# Patient Record
Sex: Female | Born: 1955 | Race: Black or African American | Hispanic: No | Marital: Married | Smoking: Former smoker
Health system: Southern US, Community
[De-identification: ages and names within clinical notes are randomized; demographics above are authoritative.]

## PROBLEM LIST (undated history)

## (undated) DIAGNOSIS — I10 Essential (primary) hypertension: Secondary | ICD-10-CM

## (undated) DIAGNOSIS — C189 Malignant neoplasm of colon, unspecified: Secondary | ICD-10-CM

## (undated) DIAGNOSIS — K219 Gastro-esophageal reflux disease without esophagitis: Secondary | ICD-10-CM

## (undated) DIAGNOSIS — E7800 Pure hypercholesterolemia, unspecified: Secondary | ICD-10-CM

## (undated) DIAGNOSIS — J449 Chronic obstructive pulmonary disease, unspecified: Secondary | ICD-10-CM

## (undated) DIAGNOSIS — E039 Hypothyroidism, unspecified: Secondary | ICD-10-CM

## (undated) DIAGNOSIS — Z8669 Personal history of other diseases of the nervous system and sense organs: Secondary | ICD-10-CM

## (undated) DIAGNOSIS — E559 Vitamin D deficiency, unspecified: Secondary | ICD-10-CM

## (undated) DIAGNOSIS — N1831 Chronic kidney disease, stage 3a: Secondary | ICD-10-CM

## (undated) DIAGNOSIS — E785 Hyperlipidemia, unspecified: Secondary | ICD-10-CM

## (undated) HISTORY — PX: ABDOMINAL HYSTERECTOMY: SHX81

## (undated) HISTORY — PX: OTHER SURGICAL HISTORY: SHX169

---
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
## 2006-02-04 DIAGNOSIS — C189 Malignant neoplasm of colon, unspecified: Secondary | ICD-10-CM

## 2006-02-04 HISTORY — DX: Malignant neoplasm of colon, unspecified: C18.9

## 2006-02-04 HISTORY — PX: COLON SURGERY: SHX602

## 2006-03-05 ENCOUNTER — Ambulatory Visit: Payer: Self-pay | Admitting: Nurse Practitioner

## 2006-03-11 ENCOUNTER — Ambulatory Visit: Payer: Self-pay | Admitting: Gastroenterology

## 2006-03-12 ENCOUNTER — Ambulatory Visit: Payer: Self-pay | Admitting: Family Medicine

## 2006-05-12 ENCOUNTER — Ambulatory Visit: Payer: Self-pay | Admitting: Surgery

## 2006-05-12 ENCOUNTER — Other Ambulatory Visit: Payer: Self-pay

## 2006-05-26 ENCOUNTER — Inpatient Hospital Stay: Payer: Self-pay | Admitting: Surgery

## 2007-03-31 ENCOUNTER — Ambulatory Visit: Payer: Self-pay | Admitting: Nurse Practitioner

## 2008-04-04 ENCOUNTER — Ambulatory Visit: Payer: Self-pay | Admitting: Nurse Practitioner

## 2009-04-06 ENCOUNTER — Ambulatory Visit: Payer: Self-pay | Admitting: Nurse Practitioner

## 2010-05-31 ENCOUNTER — Ambulatory Visit: Payer: Self-pay | Admitting: Family Medicine

## 2011-01-03 ENCOUNTER — Ambulatory Visit: Payer: Self-pay | Admitting: Gastroenterology

## 2011-01-04 LAB — PATHOLOGY REPORT

## 2011-10-03 ENCOUNTER — Ambulatory Visit: Payer: Self-pay | Admitting: Family Medicine

## 2012-11-01 ENCOUNTER — Ambulatory Visit: Payer: Self-pay | Admitting: Internal Medicine

## 2012-11-24 ENCOUNTER — Ambulatory Visit: Payer: Self-pay | Admitting: Family Medicine

## 2012-12-09 ENCOUNTER — Ambulatory Visit: Payer: Self-pay | Admitting: Family Medicine

## 2013-07-22 ENCOUNTER — Ambulatory Visit: Payer: Self-pay | Admitting: Family Medicine

## 2017-11-19 ENCOUNTER — Encounter: Payer: Self-pay | Admitting: Physical Therapy

## 2017-11-19 ENCOUNTER — Ambulatory Visit: Payer: BLUE CROSS/BLUE SHIELD | Attending: Orthopedic Surgery | Admitting: Physical Therapy

## 2017-11-19 DIAGNOSIS — R2689 Other abnormalities of gait and mobility: Secondary | ICD-10-CM | POA: Diagnosis present

## 2017-11-19 DIAGNOSIS — M6281 Muscle weakness (generalized): Secondary | ICD-10-CM | POA: Diagnosis present

## 2017-11-19 DIAGNOSIS — M25662 Stiffness of left knee, not elsewhere classified: Secondary | ICD-10-CM | POA: Diagnosis present
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
## 2017-11-19 NOTE — Patient Instructions (Signed)
Access Code: [email or web address]  URL: [email or web address]  Date: 11/19/2017  Prepared by: [place] Nation   Exercises  Supine Quad Set - 10 reps - 3 sets - 1x daily - 7x weekly  Supine Heel Slides - 10 reps - 3 sets - 1x daily - 7x weekly  4 Way Patellar Glide - 10 reps - 3 sets - 1x daily - 7x weekly

## 2017-11-25 ENCOUNTER — Ambulatory Visit: Payer: BLUE CROSS/BLUE SHIELD | Admitting: Physical Therapy

## 2017-11-25 ENCOUNTER — Encounter: Payer: Self-pay | Admitting: Physical Therapy

## 2017-11-25 DIAGNOSIS — R2689 Other abnormalities of gait and mobility: Secondary | ICD-10-CM

## 2017-11-25 DIAGNOSIS — M25662 Stiffness of left knee, not elsewhere classified: Secondary | ICD-10-CM

## 2017-11-25 DIAGNOSIS — M6281 Muscle weakness (generalized): Secondary | ICD-10-CM

## 2017-11-25 NOTE — Therapy (Signed)
[facility] [address] [place], [place], [address] Phone: [phone]   Fax:  [phone]  Physical Therapy Treatment  Patient Details  Name: Patricia Rose MRN: [id number] Date of Birth: May 12, 1955 Referring Provider (PT): Allean Found, MD   Encounter Date: 11/25/2017  PT End of Session - 11/25/17 1726    Visit Number  2    Number of Visits  8    Date for PT Re-Evaluation  12/17/17    PT Start Time  1426    PT Stop Time  1511    PT Time Calculation (min)  45 min    Equipment Utilized During Treatment  Left knee immobilizer    Activity Tolerance  Patient tolerated treatment well;Patient limited by pain    Behavior During Therapy  [facility] for tasks assessed/performed       History reviewed. No pertinent past medical history.  History reviewed. No pertinent surgical history.  There were no vitals filed for this visit.  Subjective Assessment - 11/25/17 1424    Subjective  Patient reports no significant changes since her last visit and has been doing the exercises on the RLE. She states she has no pain unless she has been sitting for awhile.    Patient is accompained by:  Family member    Limitations  Lifting;Standing;Walking;House hold activities    Diagnostic tests  X-ray    Patient Stated Goals  Increase L knee ROM/ strength    Currently in Pain?  No/denies    Pain Score  0-No pain       TREATMENT  Therapeutic Exercise: Glute sets 10 sec hold x10 R quad sets 5 sec hold x10 x 3 L heel slides/active flexion to 45*, 5 sec hold x10 L heel slides/passive flexion to 45-50* 20 sec hold x3 L quad sets 25% 3 sec hold x10 L hamstring isometrics 10 sec hold x10  Patient educated on HEP, surgical protocol, and expectations for timeline of wearing immobilizer and using crutches (per MD protocol).  Manual Therapy: Scar tissue massage/mobilization x6 min Gentle patella glides M/L grade 1/2 x8 min  Incision site looks  clean and there is no noted warmth or discoloration. Legs appear more symmetrical girth with decreased swelling. Patient donned and doffed immobilizer and compression stocking for therapy demonstrating competence with the device and management of the leg.    PT Long Term Goals - 11/19/17 1530      PT LONG TERM GOAL #1   Title  Pt. will complete FOTO and improve to 56 to improve daily functional mobility.    Baseline  11/19/17 FOTO: 30    Time  4    Period  Weeks    Status  New    Target Date  12/17/17      PT LONG TERM GOAL #2   Title  Pt. will achieve 0 degrees of knee extension on L LE for functional mobility around home.     Baseline  11/19/17 PROM: -6 deg    Time  4    Period  Weeks    Status  New    Target Date  12/17/17      PT LONG TERM GOAL #3   Title  Pt will increase strength of L LE by at least 1/2 MMT grade in order to demonstrate improvement in strength and function     Baseline  Unable to assess at this time due to surgical timeframe and MD protocol.    Time  4    Period  Weeks    Status  New    Target Date  12/17/17      PT LONG TERM GOAL #4   Title  Pt. will decrease overall edema of L LE to be WNL when compared to R LE.    Baseline  Circumferental Measurements L/R: Mid Calf: 31cm/29cm, Distal Incision Site: 33cm/31cm, 10 cm Superior to Patella: 45cm/39cm    Time  4    Period  Weeks    Status  New    Target Date  12/17/17            Plan - 11/25/17 1734    Clinical Impression Statement  Patient presents to clinic with no complaints of pain and is amenable to therapy. Patient was able to participate in protocol exercises with noted control of early quadriceps isometric contraction at 25% effort. Additionally, gentle medial/lateral mobilizations of the L patella indicate overall hypomobility, but the patella is noted to be moving appropriately for this stage post-surgically. Patient will continue to benefit from skilled therapeutic intervention in order to  address deficits in ROM, strength, and function, and improve overall QOL.    Rehab Potential  Good    PT Frequency  2x / week    PT Duration  4 weeks    PT Treatment/Interventions  Cryotherapy;Electrical Stimulation;Iontophoresis 4mg /ml Dexamethasone;Moist Heat;Traction;Ultrasound;Gait training;Stair training;Functional mobility training;Therapeutic activities;Therapeutic exercise;Manual techniques;Patient/family education;Neuromuscular re-education;Balance training;Scar mobilization;Energy conservation;Dry needling;Passive range of motion    PT Next Visit Plan  Progress treatment per MD protocol    PT Home Exercise Plan  see notes.    Consulted and Agree with Plan of Care  Patient;Family member/caregiver    Family Member Consulted  Husband       Patient will benefit from skilled therapeutic intervention in order to improve the following deficits and impairments:  Abnormal gait, Decreased endurance, Hypomobility, Impaired sensation, Increased edema, Decreased strength, Decreased activity tolerance, Decreased mobility, Difficulty walking, Decreased range of motion, Impaired flexibility  Visit Diagnosis: Muscle weakness (generalized)  Other abnormalities of gait and mobility  Decreased range of motion (ROM) of left knee     Problem List There are no active problems to display for this patient.   Myles Gip PT, DPT [phone] 11/25/2017, 5:38 PM  [facility] [address] [place], [place], [address] Phone: [phone]   Fax:  [phone]  Name: Patricia Rose MRN: [id number] Date of Birth: 1955/04/15

## 2017-11-27 ENCOUNTER — Encounter: Payer: Self-pay | Admitting: Physical Therapy

## 2017-11-27 ENCOUNTER — Ambulatory Visit: Payer: BLUE CROSS/BLUE SHIELD | Admitting: Physical Therapy

## 2017-11-27 DIAGNOSIS — M6281 Muscle weakness (generalized): Secondary | ICD-10-CM

## 2017-11-27 DIAGNOSIS — R2689 Other abnormalities of gait and mobility: Secondary | ICD-10-CM

## 2017-11-27 DIAGNOSIS — M25662 Stiffness of left knee, not elsewhere classified: Secondary | ICD-10-CM

## 2017-11-27 NOTE — Therapy (Signed)
[facility] [address] [place], [place], [address] Phone: [phone]   Fax:  [phone]  Physical Therapy Treatment  Patient Details  Name: Patricia Rose MRN: [id number] Date of Birth: 27-Mar-1955 Referring Provider (PT): Allean Found, MD   Encounter Date: 11/27/2017  PT End of Session - 11/27/17 1405    Visit Number  3    Number of Visits  8    Date for PT Re-Evaluation  12/17/17    PT Start Time  3474    PT Stop Time  2595    PT Time Calculation (min)  58 min    Equipment Utilized During Treatment  Left knee immobilizer    Activity Tolerance  Patient tolerated treatment well;Patient limited by pain    Behavior During Therapy  [facility] for tasks assessed/performed       History reviewed. No pertinent past medical history.  History reviewed. No pertinent surgical history.  There were no vitals filed for this visit.  Subjective Assessment - 11/27/17 1404    Subjective  Pt. reports she was not terribly sore after last visit.  Pt. notes she did take Tylenol prior to therapy today.    Patient is accompained by:  Family member    Limitations  Lifting;Standing;Walking;House hold activities    Diagnostic tests  X-ray    Patient Stated Goals  Increase L knee ROM/ strength    Currently in Pain?  No/denies    Pain Score  0-No pain             TREATMENT  Therapeutic Exercise: Glute sets 10 sec hold x10 R quad sets 5 sec hold x10 x 3 L heel slides/active flexion to 59-70 deg, 5 sec hold x15 L heel slides/passive flexion to 64-78 deg, 20 sec hold x3  Pt. Able to tolerate up to 78 deg max with PROM. L quad sets 25% 3 sec hold x10 L hamstring isometrics 10 sec hold x10   Manual Therapy: Scar tissue massage/mobilization x6 min Gentle patella glides M/L grade 1/2 x8 min  Patient educated on seated heel slides and breathing strategies to manage pain during ROM exercises.    PT Long Term Goals - 11/19/17  1530      PT LONG TERM GOAL #1   Title  Pt. will complete FOTO and improve to 56 to improve daily functional mobility.    Baseline  11/19/17 FOTO: 30    Time  4    Period  Weeks    Status  New    Target Date  12/17/17      PT LONG TERM GOAL #2   Title  Pt. will achieve 0 degrees of knee extension on L LE for functional mobility around home.     Baseline  11/19/17 PROM: -6 deg    Time  4    Period  Weeks    Status  New    Target Date  12/17/17      PT LONG TERM GOAL #3   Title  Pt will increase strength of L LE by at least 1/2 MMT grade in order to demonstrate improvement in strength and function     Baseline  Unable to assess at this time due to surgical timeframe and MD protocol.    Time  4    Period  Weeks    Status  New    Target Date  12/17/17      PT LONG TERM GOAL #4   Title  Pt. will decrease overall edema of L LE to be WNL when compared to R LE.    Baseline  Circumferental Measurements L/R: Mid Calf: 31cm/29cm, Distal Incision Site: 33cm/31cm, 10 cm Superior to Patella: 45cm/39cm    Time  4    Period  Weeks    Status  New    Target Date  12/17/17            Plan - 11/27/17 1406    Clinical Impression Statement  Pt. is making marked progress with L knee flexion n ability to perform AROM.  Pt. able to achieve greater range today as noted in detail while following MD protocol.  Pt. will continue with current HEP until next visit, where HEP will be updated per MD protocol in order to achieve 90 deg of AROM of L knee flexion.      Clinical Presentation  Evolving    Clinical Decision Making  Moderate    Rehab Potential  Good    PT Frequency  2x / week    PT Duration  4 weeks    PT Treatment/Interventions  Cryotherapy;Electrical Stimulation;Iontophoresis 4mg /ml Dexamethasone;Moist Heat;Traction;Ultrasound;Gait training;Stair training;Functional mobility training;Therapeutic activities;Therapeutic exercise;Manual techniques;Patient/family education;Neuromuscular  re-education;Balance training;Scar mobilization;Energy conservation;Dry needling;Passive range of motion    PT Next Visit Plan  Progress treatment per MD protocol    PT Home Exercise Plan  see notes.    Consulted and Agree with Plan of Care  Patient;Family member/caregiver    Family Member Consulted  Husband       Patient will benefit from skilled therapeutic intervention in order to improve the following deficits and impairments:  Abnormal gait, Decreased endurance, Hypomobility, Impaired sensation, Increased edema, Decreased strength, Decreased activity tolerance, Decreased mobility, Difficulty walking, Decreased range of motion, Impaired flexibility  Visit Diagnosis: Muscle weakness (generalized)  Other abnormalities of gait and mobility  Decreased range of motion (ROM) of left knee     Problem List There are no active problems to display for this patient.   [name], SPT  This entire session was performed under direct supervision and direction of a licensed therapist/therapist assistant . I have personally read, edited and approve of the note as written.  Myles Gip PT, DPT [phone] 11/27/2017, 5:54 PM  [facility] [address] [place], [place], [address] Phone: [phone]   Fax:  [phone]  Name: ALEXANDRIA SHIFLETT MRN: [id number] Date of Birth: 02/15/1955

## 2017-12-01 ENCOUNTER — Ambulatory Visit: Payer: BLUE CROSS/BLUE SHIELD | Admitting: Physical Therapy

## 2017-12-01 DIAGNOSIS — M6281 Muscle weakness (generalized): Secondary | ICD-10-CM | POA: Diagnosis not present

## 2017-12-01 DIAGNOSIS — R2689 Other abnormalities of gait and mobility: Secondary | ICD-10-CM

## 2017-12-01 DIAGNOSIS — M25662 Stiffness of left knee, not elsewhere classified: Secondary | ICD-10-CM

## 2017-12-01 NOTE — Therapy (Signed)
[facility] [address] [place], [place], [address] Phone: [phone]   Fax:  [phone]  Physical Therapy Treatment  Patient Details  Name: Patricia Rose MRN: [id number] Date of Birth: 1955-08-15 Referring Provider (PT): Allean Found, MD   Encounter Date: 12/01/2017  PT End of Session - 12/01/17 1706    Visit Number  4    Number of Visits  8    Date for PT Re-Evaluation  12/17/17    PT Start Time  4128    PT Stop Time  1610    PT Time Calculation (min)  55 min    Equipment Utilized During Treatment  Left knee immobilizer    Activity Tolerance  Patient tolerated treatment well;Patient limited by pain    Behavior During Therapy  [facility] for tasks assessed/performed       No past medical history on file.  No past surgical history on file.  There were no vitals filed for this visit.  Subjective Assessment - 12/01/17 1512    Subjective  Pt. reports to be doing HEP, specifically her knee flexion exercises at home.  No notes of increased pain when performing HEP except at end range flexion.      Patient is accompained by:  Family member    Limitations  Lifting;Standing;Walking;House hold activities    Diagnostic tests  X-ray    Patient Stated Goals  Increase L knee ROM/ strength    Currently in Pain?  No/denies    Pain Score  0-No pain            TREATMENT   Therapeutic Exercise: Glute sets 10 sec hold x10 R quad sets 5 sec hold x10 x 3 Seated/Supine L heel slides/active flexion to 82 deg, 5 sec hold x15 L heel slides/passive flexion to 84 deg, 20 sec hold x3 L quad sets 25% 3 sec hold x10 L hamstring isometrics 10 sec hold x10 Walking in // bars with ~80% body weight x4  Seated L heel slides/active flexion to 82 deg   Manual Therapy: Scar tissue massage/mobilization x6 min Gentle patella glides M/L grade I-II x8 min  Patient educated on seated heel slides and breathing strategies to manage pain  during ROM exercises.         PT Long Term Goals - 11/19/17 1530      PT LONG TERM GOAL #1   Title  Pt. will complete FOTO and improve to 56 to improve daily functional mobility.    Baseline  11/19/17 FOTO: 30    Time  4    Period  Weeks    Status  New    Target Date  12/17/17      PT LONG TERM GOAL #2   Title  Pt. will achieve 0 degrees of knee extension on L LE for functional mobility around home.     Baseline  11/19/17 PROM: -6 deg    Time  4    Period  Weeks    Status  New    Target Date  12/17/17      PT LONG TERM GOAL #3   Title  Pt will increase strength of L LE by at least 1/2 MMT grade in order to demonstrate improvement in strength and function     Baseline  Unable to assess at this time due to surgical timeframe and MD protocol.    Time  4    Period  Weeks    Status  New  Target Date  12/17/17      PT LONG TERM GOAL #4   Title  Pt. will decrease overall edema of L LE to be WNL when compared to R LE.    Baseline  Circumferental Measurements L/R: Mid Calf: 31cm/29cm, Distal Incision Site: 33cm/31cm, 10 cm Superior to Patella: 45cm/39cm    Time  4    Period  Weeks    Status  New    Target Date  12/17/17            Plan - 12/01/17 1707    Clinical Impression Statement  Pt. continues to progress towards MD protocol and is able to achieve 82 deg of L knee flexion actively.  Pt. will continue to perform HEP and follow MD protocol to increase ROM and increase weight bearing as tolerated.  Pt. was able to walk in // bars with no increase in pain and support 80% body weight with no discomfort.    Clinical Presentation  Evolving    Clinical Decision Making  Moderate    Rehab Potential  Good    PT Frequency  2x / week    PT Duration  4 weeks    PT Treatment/Interventions  Cryotherapy;Electrical Stimulation;Iontophoresis 4mg /ml Dexamethasone;Moist Heat;Traction;Ultrasound;Gait training;Stair training;Functional mobility training;Therapeutic  activities;Therapeutic exercise;Manual techniques;Patient/family education;Neuromuscular re-education;Balance training;Scar mobilization;Energy conservation;Dry needling;Passive range of motion    PT Next Visit Plan  Progress treatment per MD protocol    PT Home Exercise Plan  see notes.    Consulted and Agree with Plan of Care  Patient;Family member/caregiver    Family Member Consulted  Husband       Patient will benefit from skilled therapeutic intervention in order to improve the following deficits and impairments:  Abnormal gait, Decreased endurance, Hypomobility, Impaired sensation, Increased edema, Decreased strength, Decreased activity tolerance, Decreased mobility, Difficulty walking, Decreased range of motion, Impaired flexibility  Visit Diagnosis: Muscle weakness (generalized)  Other abnormalities of gait and mobility  Decreased range of motion (ROM) of left knee     Problem List There are no active problems to display for this patient.   [name], SPT  This entire session was performed under direct supervision and direction of a licensed therapist/therapist assistant . I have personally read, edited and approve of the note as written.  Myles Gip PT, DPT [phone] 12/01/2017, 5:12 PM  [facility] [address] [place], [place], [address] Phone: [phone]   Fax:  [phone]  Name: Patricia Rose MRN: [id number] Date of Birth: 09-21-1955

## 2017-12-03 ENCOUNTER — Ambulatory Visit: Payer: BLUE CROSS/BLUE SHIELD | Admitting: Physical Therapy

## 2017-12-03 ENCOUNTER — Encounter: Payer: Self-pay | Admitting: Physical Therapy

## 2017-12-03 DIAGNOSIS — M25662 Stiffness of left knee, not elsewhere classified: Secondary | ICD-10-CM

## 2017-12-03 DIAGNOSIS — M6281 Muscle weakness (generalized): Secondary | ICD-10-CM

## 2017-12-03 DIAGNOSIS — R2689 Other abnormalities of gait and mobility: Secondary | ICD-10-CM

## 2017-12-03 NOTE — Therapy (Signed)
[facility] [address]. [place], [place], [address] Phone: [phone]   Fax:  [phone]  Physical Therapy Treatment  Patient Details  Name: Patricia Rose MRN: [id number] Date of Birth: 06-11-1955 Referring Provider (PT): Allean Found, MD   Encounter Date: 12/03/2017  PT End of Session - 12/03/17 1527    Visit Number  5    Number of Visits  8    Date for PT Re-Evaluation  12/17/17    PT Start Time  0626    PT Stop Time  1511    PT Time Calculation (min)  53 min    Equipment Utilized During Treatment  Left knee immobilizer    Activity Tolerance  Patient tolerated treatment well;Patient limited by pain    Behavior During Therapy  [facility] for tasks assessed/performed       History reviewed. No pertinent past medical history.  History reviewed. No pertinent surgical history.  There were no vitals filed for this visit.  Subjective Assessment - 12/03/17 1526    Subjective  Pt. has been consistent with HEP and reports to be walking much better with decreased use of crutches.    Patient is accompained by:  Family member    Limitations  Lifting;Standing;Walking;House hold activities    Diagnostic tests  X-ray    Patient Stated Goals  Increase L knee ROM/ strength    Currently in Pain?  No/denies    Pain Score  0-No pain       TREATMENT   Therapeutic Exercise: Glute sets 10 sec hold x10 R quad sets 5 sec hold x10 x 3 Seated/Supine L heel slides/active flexion to85 deg, 5 sec hold x15 L heel slides/passive flexion to91 deg,20 sec hold x3 L quad sets 25% 3 sec hold x10 L hamstring isometrics 10 sec hold x10 Walking in // bars with ~90% body weight x4    Manual Therapy: Scar tissue massage/mobilization x7 min Gentle patella glides M/L grade I-II x7 min    PT Long Term Goals - 11/19/17 1530      PT LONG TERM GOAL #1   Title  Pt. will complete FOTO and improve to 56 to improve daily functional  mobility.    Baseline  11/19/17 FOTO: 30    Time  4    Period  Weeks    Status  New    Target Date  12/17/17      PT LONG TERM GOAL #2   Title  Pt. will achieve 0 degrees of knee extension on L LE for functional mobility around home.     Baseline  11/19/17 PROM: -6 deg    Time  4    Period  Weeks    Status  New    Target Date  12/17/17      PT LONG TERM GOAL #3   Title  Pt will increase strength of L LE by at least 1/2 MMT grade in order to demonstrate improvement in strength and function     Baseline  Unable to assess at this time due to surgical timeframe and MD protocol.    Time  4    Period  Weeks    Status  New    Target Date  12/17/17      PT LONG TERM GOAL #4   Title  Pt. will decrease overall edema of L LE to be WNL when compared to R LE.    Baseline  Circumferental Measurements L/R: Mid Calf: 31cm/29cm,  Distal Incision Site: 33cm/31cm, 10 cm Superior to Patella: 45cm/39cm    Time  4    Period  Weeks    Status  New    Target Date  12/17/17            Plan - 12/03/17 1527    Clinical Impression Statement  Pt. is making significant progress with exercises and is able to tolerate increased body weight on the L LE.  Pt. able to achieve 91 deg of L knee flexion passively.  Pt. will continue to work with HEP and follow MD protocol as treatment progresses.    Clinical Presentation  Evolving    Clinical Decision Making  Moderate    Rehab Potential  Good    PT Frequency  2x / week    PT Duration  4 weeks    PT Treatment/Interventions  Cryotherapy;Electrical Stimulation;Iontophoresis 4mg /ml Dexamethasone;Moist Heat;Traction;Ultrasound;Gait training;Stair training;Functional mobility training;Therapeutic activities;Therapeutic exercise;Manual techniques;Patient/family education;Neuromuscular re-education;Balance training;Scar mobilization;Energy conservation;Dry needling;Passive range of motion    PT Next Visit Plan  Progress treatment per MD protocol    PT Home Exercise  Plan  see notes.    Consulted and Agree with Plan of Care  Patient;Family member/caregiver    Family Member Consulted  Husband       Patient will benefit from skilled therapeutic intervention in order to improve the following deficits and impairments:  Abnormal gait, Decreased endurance, Hypomobility, Impaired sensation, Increased edema, Decreased strength, Decreased activity tolerance, Decreased mobility, Difficulty walking, Decreased range of motion, Impaired flexibility  Visit Diagnosis: Muscle weakness (generalized)  Other abnormalities of gait and mobility  Decreased range of motion (ROM) of left knee     Problem List There are no active problems to display for this patient.   Patricia Rose, SPT  This entire session was performed under direct supervision and direction of a licensed therapist/therapist assistant . I have personally read, edited and approve of the note as written.  Myles Gip PT, DPT [phone] 12/03/2017, 3:39 PM  [place] [facility] [address] [place], [place], [address] Phone: [phone]   Fax:  [phone]  Name: Patricia Rose MRN: [id number] Date of Birth: 1955/09/11

## 2017-12-08 ENCOUNTER — Ambulatory Visit: Payer: BLUE CROSS/BLUE SHIELD | Attending: Orthopedic Surgery | Admitting: Physical Therapy

## 2017-12-08 DIAGNOSIS — M25662 Stiffness of left knee, not elsewhere classified: Secondary | ICD-10-CM | POA: Insufficient documentation

## 2017-12-08 DIAGNOSIS — R2689 Other abnormalities of gait and mobility: Secondary | ICD-10-CM

## 2017-12-08 DIAGNOSIS — M6281 Muscle weakness (generalized): Secondary | ICD-10-CM | POA: Insufficient documentation

## 2017-12-08 NOTE — Therapy (Signed)
[place] [facility] [address] [place], [place], [address] Phone: [phone]   Fax:  [phone]  Physical Therapy Treatment  Patient Details  Name: Patricia Rose MRN: [id number] Date of Birth: 09-03-1955 Referring Provider (PT): Allean Found, MD   Encounter Date: 12/08/2017  PT End of Session - 12/08/17 1735    Visit Number  6    Number of Visits  8    Date for PT Re-Evaluation  12/17/17    PT Start Time  7096    PT Stop Time  1603    PT Time Calculation (min)  51 min    Equipment Utilized During Treatment  Left knee immobilizer    Activity Tolerance  Patient tolerated treatment well;Patient limited by pain    Behavior During Therapy  [facility] for tasks assessed/performed       No past medical history on file.  No past surgical history on file.  There were no vitals filed for this visit.  Subjective Assessment - 12/08/17 1734    Subjective  Pt. continues to report no increase in pain and has been performing HEP at home on a consistent basis.    Patient is accompained by:  Family member    Limitations  Lifting;Standing;Walking;House hold activities    Diagnostic tests  X-ray    Patient Stated Goals  Increase L knee ROM/ strength    Currently in Pain?  No/denies    Pain Score  0-No pain    Multiple Pain Sites  No          TREATMENT   Therapeutic Exercise: R quad sets 5 sec hold x10 x 3 Seated/SupineL heel slides/active flexion, 5 sec hold x15 L heel slides/passive flexion,20 sec hold x3 L quad sets 25% 3 sec hold x10 L hamstring isometrics 10 sec hold x10 L SLR x10 with muscle fasciculations occurring in quads   Manual Therapy: Scar tissue massage/mobilization x15 min Gentle patella glides M/L gradeI-IIx15 min      PT Long Term Goals - 11/19/17 1530      PT LONG TERM GOAL #1   Title  Pt. will complete FOTO and improve to 56 to improve daily functional mobility.    Baseline  11/19/17 FOTO: 30     Time  4    Period  Weeks    Status  New    Target Date  12/17/17      PT LONG TERM GOAL #2   Title  Pt. will achieve 0 degrees of knee extension on L LE for functional mobility around home.     Baseline  11/19/17 PROM: -6 deg    Time  4    Period  Weeks    Status  New    Target Date  12/17/17      PT LONG TERM GOAL #3   Title  Pt will increase strength of L LE by at least 1/2 MMT grade in order to demonstrate improvement in strength and function     Baseline  Unable to assess at this time due to surgical timeframe and MD protocol.    Time  4    Period  Weeks    Status  New    Target Date  12/17/17      PT LONG TERM GOAL #4   Title  Pt. will decrease overall edema of L LE to be WNL when compared to R LE.    Baseline  Circumferental Measurements L/R: Mid Calf: 31cm/29cm, Distal  Incision Site: 33cm/31cm, 10 cm Superior to Patella: 45cm/39cm    Time  4    Period  Weeks    Status  New    Target Date  12/17/17         Plan - 12/08/17 1736    Clinical Impression Statement  Pt. had difficulty with getting to 90 deg of flexion today with both PROM/AROM.  Pt. was able to achieve 84 deg of PROM and 82 of AROM in seated position.  Pt. will continue with HEP supplied and follow MD protocol at treatment progresses.  Pt. did demonstrate increased SLR strength and was able to perform x10.    Clinical Presentation  Evolving    Clinical Decision Making  Moderate    Rehab Potential  Good    PT Frequency  2x / week    PT Duration  4 weeks    PT Treatment/Interventions  Cryotherapy;Electrical Stimulation;Iontophoresis 4mg /ml Dexamethasone;Moist Heat;Traction;Ultrasound;Gait training;Stair training;Functional mobility training;Therapeutic activities;Therapeutic exercise;Manual techniques;Patient/family education;Neuromuscular re-education;Balance training;Scar mobilization;Energy conservation;Dry needling;Passive range of motion    PT Next Visit Plan  Progress treatment per MD protocol    PT  Home Exercise Plan  see notes.    Consulted and Agree with Plan of Care  Patient;Family member/caregiver    Family Member Consulted  Husband       Patient will benefit from skilled therapeutic intervention in order to improve the following deficits and impairments:  Abnormal gait, Decreased endurance, Hypomobility, Impaired sensation, Increased edema, Decreased strength, Decreased activity tolerance, Decreased mobility, Difficulty walking, Decreased range of motion, Impaired flexibility  Visit Diagnosis: Muscle weakness (generalized)  Other abnormalities of gait and mobility  Decreased range of motion (ROM) of left knee     Problem List There are no active problems to display for this patient.  [name], PT, DPT # [id number] [name], SPT 12/08/2017, 5:38 PM  [place] [facility] [address] [place], [place], [address] Phone: [phone]   Fax:  [phone]  Name: Patricia Rose MRN: [id number] Date of Birth: Dec 22, 1955

## 2017-12-10 ENCOUNTER — Encounter: Payer: Self-pay | Admitting: Physical Therapy

## 2017-12-10 ENCOUNTER — Ambulatory Visit: Payer: BLUE CROSS/BLUE SHIELD | Admitting: Physical Therapy

## 2017-12-10 DIAGNOSIS — M6281 Muscle weakness (generalized): Secondary | ICD-10-CM | POA: Diagnosis not present

## 2017-12-10 DIAGNOSIS — M25662 Stiffness of left knee, not elsewhere classified: Secondary | ICD-10-CM

## 2017-12-10 DIAGNOSIS — R2689 Other abnormalities of gait and mobility: Secondary | ICD-10-CM

## 2017-12-10 NOTE — Therapy (Signed)
[place] [facility] [address]. [place], [place], [address] Phone: [phone]   Fax:  [phone]  Physical Therapy Treatment  Patient Details  Name: Patricia Rose MRN: [id number] Date of Birth: May 04, 1955 Referring Provider (PT): Allean Found, MD   Encounter Date: 12/10/2017  PT End of Session - 12/10/17 1754    Visit Number  7    Number of Visits  8    Date for PT Re-Evaluation  12/17/17    PT Start Time  6644    PT Stop Time  1517    PT Time Calculation (min)  50 min    Equipment Utilized During Treatment  Left knee immobilizer    Activity Tolerance  Patient tolerated treatment well;Patient limited by pain    Behavior During Therapy  [facility] for tasks assessed/performed       No past medical history on file.  No past surgical history on file.  There were no vitals filed for this visit.  Subjective Assessment - 12/10/17 1753    Subjective  Pt. reports no increase in pain, however her leg feels somewhat stiff today.    Patient is accompained by:  Family member    Limitations  Lifting;Standing;Walking;House hold activities    Diagnostic tests  X-ray    Patient Stated Goals  Increase L knee ROM/ strength    Currently in Pain?  No/denies    Pain Score  0-No pain          TREATMENT   Therapeutic Exercise: R quad sets 5 sec hold x10 x 3 Seated/SupineL heel slides/active flexion, 5 sec hold x15 L heel slides/passive flexion,20 sec hold x3 L quad sets 25% 3 sec hold x10 L hamstring isometrics 10 sec hold x10 L SLR x10 with muscle fasciculations occurring in quads Ambulation in hallway with single crutch, x4 Ambulation in hallway with handheld support, x4  Manual Therapy: Scar tissue massage/mobilization x28min Gentle patella glides M/L gradeI-IIx37min      PT Long Term Goals - 11/19/17 1530      PT LONG TERM GOAL #1   Title  Pt. will complete FOTO and improve to 56 to improve daily functional  mobility.    Baseline  11/19/17 FOTO: 30    Time  4    Period  Weeks    Status  New    Target Date  12/17/17      PT LONG TERM GOAL #2   Title  Pt. will achieve 0 degrees of knee extension on L LE for functional mobility around home.     Baseline  11/19/17 PROM: -6 deg    Time  4    Period  Weeks    Status  New    Target Date  12/17/17      PT LONG TERM GOAL #3   Title  Pt will increase strength of L LE by at least 1/2 MMT grade in order to demonstrate improvement in strength and function     Baseline  Unable to assess at this time due to surgical timeframe and MD protocol.    Time  4    Period  Weeks    Status  New    Target Date  12/17/17      PT LONG TERM GOAL #4   Title  Pt. will decrease overall edema of L LE to be WNL when compared to R LE.    Baseline  Circumferental Measurements L/R: Mid Calf: 31cm/29cm, Distal Incision Site: 33cm/31cm,  10 cm Superior to Patella: 45cm/39cm    Time  4    Period  Weeks    Status  New    Target Date  12/17/17         Plan - 12/10/17 1755    Clinical Impression Statement  Pt. was able to achieve 94 deg of knee flexion with slight overpressure from therapist.  Pt. did have significant hypomobility of L LE, however progressed to being able to tolerate slight overpressure of knee flexion.  P. consistently has adequate knee extension PROM.  Pt. to continue with therapy per MD protocol and will be seeing MD next week.    Clinical Presentation  Evolving    Clinical Decision Making  Moderate    Rehab Potential  Good    PT Frequency  2x / week    PT Duration  4 weeks    PT Treatment/Interventions  Cryotherapy;Electrical Stimulation;Iontophoresis 4mg /ml Dexamethasone;Moist Heat;Traction;Ultrasound;Gait training;Stair training;Functional mobility training;Therapeutic activities;Therapeutic exercise;Manual techniques;Patient/family education;Neuromuscular re-education;Balance training;Scar mobilization;Energy conservation;Dry needling;Passive range  of motion    PT Next Visit Plan  Progress treatment per MD protocol    PT Home Exercise Plan  see notes.    Consulted and Agree with Plan of Care  Patient;Family member/caregiver    Family Member Consulted  Husband       Patient will benefit from skilled therapeutic intervention in order to improve the following deficits and impairments:  Abnormal gait, Decreased endurance, Hypomobility, Impaired sensation, Increased edema, Decreased strength, Decreased activity tolerance, Decreased mobility, Difficulty walking, Decreased range of motion, Impaired flexibility  Visit Diagnosis: Muscle weakness (generalized)  Other abnormalities of gait and mobility  Decreased range of motion (ROM) of left knee     Problem List There are no active problems to display for this patient.  [name], PT, DPT # [id number] [name], SPT 12/10/2017, 6:08 PM  [place] [facility] [address] [place], [place], [address] Phone: [phone]   Fax:  [phone]  Name: Patricia Rose MRN: [id number] Date of Birth: 1955/05/18

## 2017-12-11 ENCOUNTER — Encounter: Payer: Self-pay | Admitting: Physical Therapy

## 2017-12-15 ENCOUNTER — Ambulatory Visit: Payer: BLUE CROSS/BLUE SHIELD | Admitting: Physical Therapy

## 2017-12-15 DIAGNOSIS — M6281 Muscle weakness (generalized): Secondary | ICD-10-CM

## 2017-12-15 DIAGNOSIS — R2689 Other abnormalities of gait and mobility: Secondary | ICD-10-CM

## 2017-12-15 DIAGNOSIS — M25662 Stiffness of left knee, not elsewhere classified: Secondary | ICD-10-CM

## 2017-12-15 NOTE — Therapy (Signed)
[facility] [address] [place], [place], [address] Phone: [phone]   Fax:  [phone]  Physical Therapy Treatment  Patient Details  Name: Patricia Rose MRN: [id number] Date of Birth: [birth year] Referring Provider (PT): Allean Found, MD   Encounter Date: 12/15/2017  PT End of Session - 12/15/17 1609    Visit Number  8    Number of Visits  8    Date for PT Re-Evaluation  12/17/17    PT Start Time  4431    PT Stop Time  1524    PT Time Calculation (min)  53 min    Equipment Utilized During Treatment  Left knee immobilizer    Activity Tolerance  Patient tolerated treatment well;Patient limited by pain    Behavior During Therapy  [facility] for tasks assessed/performed       No past medical history on file.  No past surgical history on file.  There were no vitals filed for this visit.  Subjective Assessment - 12/15/17 1555    Subjective  Pt. reports no new issues.  Pt. states she has no pain at rest and >8/10 L knee pain with PROM.      Patient is accompained by:  Family member    Limitations  Lifting;Standing;Walking;House hold activities    Diagnostic tests  X-ray    Patient Stated Goals  Increase L knee ROM/ strength    Currently in Pain?  No/denies         TREATMENT   Therapeutic Exercise: Rquad sets 5 sec hold x10 x 3 Seated/SupineL heel slides/active flexion, 5 sec hold x15 L heel slides/passive flexion,20 sec hold x3 L quad sets 25% 3 sec hold x10 L hamstring isometrics 10 sec hold x10 R SLR x20 with muscle fasciculations Ambulation in hallway with single crutch progressing to no assistive device  Manual Therapy: Scar tissue massage/mobilization x75min Gentle patella glides M/L gradeI-IIx30min Supine L knee AA/PROM flexion with static holds (as tolerated).     PT Long Term Goals - 11/19/17 1530      PT LONG TERM GOAL #1   Title  Pt. will complete FOTO and improve to 56 to improve daily  functional mobility.    Baseline  11/19/17 FOTO: 30    Time  4    Period  Weeks    Status  New    Target Date  12/17/17      PT LONG TERM GOAL #2   Title  Pt. will achieve 0 degrees of knee extension on L LE for functional mobility around home.     Baseline  11/19/17 PROM: -6 deg    Time  4    Period  Weeks    Status  New    Target Date  12/17/17      PT LONG TERM GOAL #3   Title  Pt will increase strength of L LE by at least 1/2 MMT grade in order to demonstrate improvement in strength and function     Baseline  Unable to assess at this time due to surgical timeframe and MD protocol.    Time  4    Period  Weeks    Status  New    Target Date  12/17/17      PT LONG TERM GOAL #4   Title  Pt. will decrease overall edema of L LE to be WNL when compared to R LE.    Baseline  Circumferental Measurements L/R: Mid Calf: 31cm/29cm, Distal  Incision Site: 33cm/31cm, 10 cm Superior to Patella: 45cm/39cm    Time  4    Period  Weeks    Status  New    Target Date  12/17/17          Plan - 12/15/17 1610    Clinical Impression Statement  Pt. demonstrates moderate L knee joint stiffness with flexion in supine position prior to manual tx. session.  L knee AROM: 95 deg. (extra time/ cuing) and 101 deg. PROM in supine.  No c/o L knee pain at rest and will walking in hallway with no assistive device except L knee brace.      Clinical Presentation  Evolving    Clinical Decision Making  Moderate    Rehab Potential  Good    PT Frequency  2x / week    PT Duration  4 weeks    PT Treatment/Interventions  Cryotherapy;Electrical Stimulation;Iontophoresis 4mg /ml Dexamethasone;Moist Heat;Traction;Ultrasound;Gait training;Stair training;Functional mobility training;Therapeutic activities;Therapeutic exercise;Manual techniques;Patient/family education;Neuromuscular re-education;Balance training;Scar mobilization;Energy conservation;Dry needling;Passive range of motion    PT Next Visit Plan  Progress  treatment per MD protocol.  CHECK GOALS/ RECERT    PT Home Exercise Plan  see notes.       Patient will benefit from skilled therapeutic intervention in order to improve the following deficits and impairments:  Abnormal gait, Decreased endurance, Hypomobility, Impaired sensation, Increased edema, Decreased strength, Decreased activity tolerance, Decreased mobility, Difficulty walking, Decreased range of motion, Impaired flexibility  Visit Diagnosis: Muscle weakness (generalized)  Other abnormalities of gait and mobility  Decreased range of motion (ROM) of left knee     Problem List There are no active problems to display for this patient.  [name], PT, DPT # [phone] 12/15/2017, 4:31 PM  [place] [facility] [address] [place], [place], [address] Phone: [phone]   Fax:  [phone]  Name: Patricia Rose MRN: [id number] Date of Birth: 01/21/1956

## 2017-12-17 ENCOUNTER — Ambulatory Visit: Payer: BLUE CROSS/BLUE SHIELD | Admitting: Physical Therapy

## 2017-12-17 ENCOUNTER — Encounter: Payer: Self-pay | Admitting: Physical Therapy

## 2017-12-17 DIAGNOSIS — R2689 Other abnormalities of gait and mobility: Secondary | ICD-10-CM

## 2017-12-17 DIAGNOSIS — M25662 Stiffness of left knee, not elsewhere classified: Secondary | ICD-10-CM

## 2017-12-17 DIAGNOSIS — M6281 Muscle weakness (generalized): Secondary | ICD-10-CM

## 2017-12-19 NOTE — Therapy (Signed)
[facility] [address] [place], [place], [address] Phone: [phone]   Fax:  [phone]  Physical Therapy Treatment  Patient Details  Name: Patricia Rose MRN: [id number] Date of Birth: 09-08-1955 Referring Provider (PT): Allean Found, MD   Encounter Date: 12/17/2017  Treatment: 9 of 17.  Recert date: 56/25/63 8937 to 1522   History reviewed. No pertinent past medical history.  History reviewed. No pertinent surgical history.  There were no vitals filed for this visit.    [facility] PT Assessment - 12/20/17 0001      Assessment   Medical Diagnosis  L Patellar ORIF    Referring Provider (PT)  Allean Found, MD    Onset Date/Surgical Date  11/11/17    Prior Therapy  No      Balance Screen   Has the patient fallen in the past 6 months  No      Prior Function   Level of Independence  Independent          Pt. states she returns to MD this Friday for f/u appt. Pt. has remained compliant with HEP and use of brace per MD protocol.          TREATMENT   Therapeutic Exercise: Rquad sets 5 sec hold x10 x 3 Seated/SupineL heel slides/active flexion, 5 sec hold x15 L heel slides/passive flexion,20 sec hold x3 L quad sets (as tolerated) with increase hold L hamstring isometrics 10 sec hold x10 R SLR x20 with muscle fasciculations Ambulation in hallway with single crutch progressing to no assistive device  Manual Therapy: Scar tissue massage/mobilization x62mn Gentle patella glides M/L gradeI-IIx514m Supine L knee AA/PROM flexion with static holds (as tolerated).          FOTO: initial: 30/ today: 48/ goal: 56.  Pt. remains hypomobile with L knee flexion AROM (96 deg.)- after manual tx./ cuing.  Pt. able to increase L knee flexion to 102 deg. with PROM/ manual holds.  Pt. limited by L knee pain/ significant suprapatellar tendon tightness.  PT has been progressing pt. with specific MD  protocol.  Pt. will continue with HEP at this time and will discuss any change in tx. status after MD f/u visits.       PT Long Term Goals - 12/17/17 2029      PT LONG TERM GOAL #1   Title  Pt. will complete FOTO and improve to 56 to improve daily functional mobility.    Baseline  11/19/17 FOTO: 30.  11/13: 48    Time  4    Period  Weeks    Status  Partially Met    Target Date  01/15/18      PT LONG TERM GOAL #2   Title  Pt. will achieve 0 degrees of knee extension on L LE for functional mobility around home.     Baseline  11/19/17 PROM: -6 deg.  11/13: 0 deg. extension in supine position    Time  4    Period  Weeks    Status  Achieved    Target Date  12/17/17      PT LONG TERM GOAL #3   Title  Pt will increase strength of L LE by at least 1/2 MMT grade in order to demonstrate improvement in strength and function     Baseline  Unable to assess at this time due to surgical timeframe and MD protocol.    Time  4    Period  Weeks  Status  On-going    Target Date  01/15/18      PT LONG TERM GOAL #4   Title  Pt. will decrease overall edema of L LE to be WNL when compared to R LE.    Baseline  Minimal L knee joint line swelling noted but moderate L quad muscle atrophy.      Time  4    Period  Weeks    Status  Achieved    Target Date  12/17/17      PT LONG TERM GOAL #5   Title  Pt. will increase L knee flexion to >110 deg. to improve sit to stands/ gait pattern progression outside of brace.      Baseline  Pt. remains hypomobile with L knee flexion AROM (96 deg.)- after manual tx./ cuing. Pt. able to increase L knee flexion to 102 deg. with PROM/ manual holds. Pt. limited by L knee pain    Time  4    Period  Weeks    Status  New    Target Date  01/15/18          Patient will benefit from skilled therapeutic intervention in order to improve the following deficits and impairments:  Abnormal gait, Decreased endurance, Hypomobility, Impaired sensation, Increased edema,  Decreased strength, Decreased activity tolerance, Decreased mobility, Difficulty walking, Decreased range of motion, Impaired flexibility  Visit Diagnosis: Muscle weakness (generalized)  Other abnormalities of gait and mobility  Decreased range of motion (ROM) of left knee     Problem List There are no active problems to display for this patient.  [name], PT, DPT # [phone] 12/20/2017, 7:28 AM  [facility] [address] [place], [place], [address] Phone: [phone]   Fax:  [phone]  Name: Patricia Rose MRN: [id number] Date of Birth: [birth year]

## 2017-12-22 ENCOUNTER — Ambulatory Visit: Payer: BLUE CROSS/BLUE SHIELD | Admitting: Physical Therapy

## 2017-12-22 DIAGNOSIS — M6281 Muscle weakness (generalized): Secondary | ICD-10-CM | POA: Diagnosis not present

## 2017-12-22 DIAGNOSIS — R2689 Other abnormalities of gait and mobility: Secondary | ICD-10-CM

## 2017-12-22 DIAGNOSIS — M25662 Stiffness of left knee, not elsewhere classified: Secondary | ICD-10-CM

## 2017-12-23 ENCOUNTER — Encounter: Payer: Self-pay | Admitting: Physical Therapy

## 2017-12-24 ENCOUNTER — Ambulatory Visit: Payer: BLUE CROSS/BLUE SHIELD | Admitting: Physical Therapy

## 2017-12-24 DIAGNOSIS — M25662 Stiffness of left knee, not elsewhere classified: Secondary | ICD-10-CM

## 2017-12-24 DIAGNOSIS — R2689 Other abnormalities of gait and mobility: Secondary | ICD-10-CM

## 2017-12-24 DIAGNOSIS — M6281 Muscle weakness (generalized): Secondary | ICD-10-CM

## 2017-12-24 NOTE — Therapy (Signed)
[facility] [address] [place], [place], [address] Phone: [phone]   Fax:  [phone]  Physical Therapy Treatment  Patient Details  Name: Patricia Rose MRN: [id number] Date of Birth: 09/07/1955 Referring Provider (PT): Alexander Creighton, MD   Encounter Date: 12/22/2017  PT End of Session - 12/24/17 0723    Visit Number  10    Number of Visits  17    Date for PT Re-Evaluation  01/15/18    PT Start Time  1511    PT Stop Time  1604    PT Time Calculation (min)  53 min    Activity Tolerance  Patient tolerated treatment well;Patient limited by pain    Behavior During Therapy  WFL for tasks assessed/performed       History reviewed. No pertinent past medical history.  History reviewed. No pertinent surgical history.  There were no vitals filed for this visit.  Subjective Assessment - 12/24/17 0719    Subjective  Pt. reports MD is happy with pts. progress.  MD adjusted knee brace to flexion to 90 deg.  Pt. has returned to driving with no limitations.  No c/o knee pain at rest.      Patient is accompained by:  Family member    Limitations  Lifting;Standing;Walking;House hold activities    Diagnostic tests  X-ray    Patient Stated Goals  Increase L knee ROM/ strength    Currently in Pain?  No/denies              TREATMENT  Therapeutic Exercise: L/Rquad sets 5 sec hold 3x10 sec.  Seated/SupineL heel slides/active flexion, 5 sec hold x15 L heel slides/passive flexion,20 sec hold x3 L/RSLR x 20.   Manual Therapy: Scar tissue massage/mobilization x8min Gentle patella glides (all planes) gradeII-III Supine L knee AA/PROM flexion with static holds (as tolerated).  101 deg. PROM.  Gait training: Ambulate in //-bars progressing to use of R UE only and 2-point gait pattern Pt. Instruction to increase L hip/knee flexion and heel strike with use of knee brace.   Progressing to use of SPC outside of //-bars.   Cuing to increase upright posture/ step pattern/ stride length.       PT Education - 12/24/17 0723    Education Details  Gait training with use of SPC    Person(s) Educated  Patient    Methods  Explanation;Demonstration    Comprehension  Verbalized understanding;Returned demonstration          PT Long Term Goals - 12/17/17 2029      PT LONG TERM GOAL #1   Title  Pt. will complete FOTO and improve to 56 to improve daily functional mobility.    Baseline  11/19/17 FOTO: 30.  11/13: 48    Time  4    Period  Weeks    Status  Partially Met    Target Date  01/15/18      PT LONG TERM GOAL #2   Title  Pt. will achieve 0 degrees of knee extension on L LE for functional mobility around home.     Baseline  11/19/17 PROM: -6 deg.  11/13: 0 deg. extension in supine position    Time  4    Period  Weeks    Status  Achieved    Target Date  12/17/17      PT LONG TERM GOAL #3   Title  Pt will increase strength of L LE by at least 1/2   MMT grade in order to demonstrate improvement in strength and function     Baseline  Unable to assess at this time due to surgical timeframe and MD protocol.    Time  4    Period  Weeks    Status  On-going    Target Date  01/15/18      PT LONG TERM GOAL #4   Title  Pt. will decrease overall edema of L LE to be WNL when compared to R LE.    Baseline  Minimal L knee joint line swelling noted but moderate L quad muscle atrophy.      Time  4    Period  Weeks    Status  Achieved    Target Date  12/17/17      PT LONG TERM GOAL #5   Title  Pt. will increase L knee flexion to >110 deg. to improve sit to stands/ gait pattern progression outside of brace.      Baseline  Pt. remains hypomobile with L knee flexion AROM (96 deg.)- after manual tx./ cuing. Pt. able to increase L knee flexion to 102 deg. with PROM/ manual holds. Pt. limited by L knee pain    Time  4    Period  Weeks    Status  New    Target Date  01/15/18            Plan - 12/24/17  0724    Clinical Impression Statement  Pt. demonstrates improved gait pattern with progression to use of SPC and brace (90 deg. knee flexion).  Pt. requires occasional cuing to increase L hip/knee flexion during swing through phase of gait pattern.  L knee AROM 92 deg. after manual stretches and PROM 101 deg. (significant c/o pain/ muscle guarding noted).  PT encouraged pt. to focus on L knee flexion to prevent scar tissue adhesions/ hypomobility.      Clinical Presentation  Evolving    Clinical Decision Making  Moderate    Rehab Potential  Good    PT Frequency  2x / week    PT Duration  4 weeks    PT Treatment/Interventions  Cryotherapy;Electrical Stimulation;Iontophoresis 55m/ml Dexamethasone;Moist Heat;Traction;Ultrasound;Gait training;Stair training;Functional mobility training;Therapeutic activities;Therapeutic exercise;Manual techniques;Patient/family education;Neuromuscular re-education;Balance training;Scar mobilization;Energy conservation;Dry needling;Passive range of motion    PT Next Visit Plan  Progress treatment per MD protocol.  Issue new HEP    PT Home Exercise Plan  see notes.    Consulted and Agree with Plan of Care  Patient;Family member/caregiver    Family Member Consulted  Husband       Patient will benefit from skilled therapeutic intervention in order to improve the following deficits and impairments:  Abnormal gait, Decreased endurance, Hypomobility, Impaired sensation, Increased edema, Decreased strength, Decreased activity tolerance, Decreased mobility, Difficulty walking, Decreased range of motion, Impaired flexibility  Visit Diagnosis: Muscle weakness (generalized)  Other abnormalities of gait and mobility  Decreased range of motion (ROM) of left knee     Problem List There are no active problems to display for this patient.  MPura Spice PT, DPT # [id number]/20/2019, 7:30 AM  [facility]12[address]ME[address]ba [place] [address]Phone: [phone]  Fax:  [phone] Name: Patricia CUTBIRTHMRN: [id number] of Birth: 112-07-1955

## 2017-12-26 ENCOUNTER — Encounter: Payer: Self-pay | Admitting: Physical Therapy

## 2017-12-27 NOTE — Therapy (Signed)
[facility] [address] [place], [place], [address] Phone: [phone]   Fax:  [phone]  Physical Therapy Treatment  Patient Details  Name: Patricia Rose MRN: [id number] Date of Birth: 1955-09-09 Referring Provider (PT): Allean Found, MD   Encounter Date: 12/24/2017  PT End of Session - 12/26/17 1038    Visit Number  11    Number of Visits  17    Date for PT Re-Evaluation  01/15/18    PT Start Time  1426    PT Stop Time  1524    PT Time Calculation (min)  58 min    Equipment Utilized During Treatment  Left knee immobilizer    Activity Tolerance  Patient tolerated treatment well;Patient limited by pain    Behavior During Therapy  [facility] for tasks assessed/performed       History reviewed. No pertinent past medical history.  History reviewed. No pertinent surgical history.  There were no vitals filed for this visit.  Subjective Assessment - 12/26/17 1037    Subjective  Pt. entered PT with use of knee brace/ SPC and consistent 2-point gait pattern.  No c/o pain reported.      Limitations  Lifting;Standing;Walking;House hold activities    Diagnostic tests  X-ray    Patient Stated Goals  Increase L knee ROM/ strength    Currently in Pain?  No/denies         TREATMENT  Nustep L5 10 min. (increase flexion)- warm-up/no charge   Therapeutic Exercise: L/Rquad sets 5 sec hold 3x10 sec.  Seated/SupineL heel slides/active flexion, 5 sec hold x15 L heel slides/passive flexion,20 sec hold x3 See new HEP (good technique/ understanding).   Manual Therapy: Scar tissue massage/mobilization x68mn Patella glides (all planes) gradeIII Supine L knee AA/PROM flexion with static holds (as tolerated).  104 deg. PROM. (pain limited)    PT Education - 12/27/17 0811    Education Details  See HEP    Person(s) Educated  Patient    Methods  Explanation;Demonstration;Handout    Comprehension  Verbalized  understanding;Returned demonstration          PT Long Term Goals - 12/17/17 2029      PT LONG TERM GOAL #1   Title  Pt. will complete FOTO and improve to 56 to improve daily functional mobility.    Baseline  11/19/17 FOTO: 30.  11/13: 48    Time  4    Period  Weeks    Status  Partially Met    Target Date  01/15/18      PT LONG TERM GOAL #2   Title  Pt. will achieve 0 degrees of knee extension on L LE for functional mobility around home.     Baseline  11/19/17 PROM: -6 deg.  11/13: 0 deg. extension in supine position    Time  4    Period  Weeks    Status  Achieved    Target Date  12/17/17      PT LONG TERM GOAL #3   Title  Pt will increase strength of L LE by at least 1/2 MMT grade in order to demonstrate improvement in strength and function     Baseline  Unable to assess at this time due to surgical timeframe and MD protocol.    Time  4    Period  Weeks    Status  On-going    Target Date  01/15/18      PT LONG TERM GOAL #  4   Title  Pt. will decrease overall edema of L LE to be WNL when compared to R LE.    Baseline  Minimal L knee joint line swelling noted but moderate L quad muscle atrophy.      Time  4    Period  Weeks    Status  Achieved    Target Date  12/17/17      PT LONG TERM GOAL #5   Title  Pt. will increase L knee flexion to >110 deg. to improve sit to stands/ gait pattern progression outside of brace.      Baseline  Pt. remains hypomobile with L knee flexion AROM (96 deg.)- after manual tx./ cuing. Pt. able to increase L knee flexion to 102 deg. with PROM/ manual holds. Pt. limited by L knee pain    Time  4    Period  Weeks    Status  New    Target Date  01/15/18            Plan - 12/26/17 1039    Clinical Impression Statement  L knee immobilizer not worn during PT tx. session.  Pt. progressing well with new HEP and demonstrates proper technique/ understanding.  No increase c/o L knee pain with standing/ walking tasks but remains pain limited with  AA/PROM flexion.      Clinical Presentation  Evolving    Clinical Decision Making  Moderate    Rehab Potential  Good    PT Frequency  2x / week    PT Duration  4 weeks    PT Treatment/Interventions  Cryotherapy;Electrical Stimulation;Iontophoresis 21m/ml Dexamethasone;Moist Heat;Traction;Ultrasound;Gait training;Stair training;Functional mobility training;Therapeutic activities;Therapeutic exercise;Manual techniques;Patient/family education;Neuromuscular re-education;Balance training;Scar mobilization;Energy conservation;Dry needling;Passive range of motion    PT Next Visit Plan  Progress treatment per MD protocol.  Discuss new HEP/ strengthening ex.      PT Home Exercise Plan  see notes.    Consulted and Agree with Plan of Care  Patient;Family member/caregiver    Family Member Consulted  Husband       Patient will benefit from skilled therapeutic intervention in order to improve the following deficits and impairments:  Abnormal gait, Decreased endurance, Hypomobility, Impaired sensation, Increased edema, Decreased strength, Decreased activity tolerance, Decreased mobility, Difficulty walking, Decreased range of motion, Impaired flexibility  Visit Diagnosis: Muscle weakness (generalized)  Other abnormalities of gait and mobility  Decreased range of motion (ROM) of left knee     Problem List There are no active problems to display for this patient.  MPura Spice PT, DPT # [phone]/23/2019, 8:13 AM  [facility] [address]Phone: [phone]  Fax:  [phone] Name: Patricia AVALLONEMRN: [id number] of Birth: [birth year]

## 2017-12-27 NOTE — Patient Instructions (Signed)
Access Code: [email or web address]  URL: [email or web address]  Date: 12/27/2017  Prepared by: Dorcas Carrow   Exercises  Seated March - 20 reps - 1 sets - 2x daily - 7x weekly  Seated Long Arc Quad - 20 reps - 1 sets - 3 hold - 2x daily - 7x weekly  Seated Heel Raise - 20 reps - 1 sets - 2x daily - 7x weekly  Seated Toe Raise - 20 reps - 1 sets - 2x daily - 7x weekly  Mini Squat with Counter Support - 20 reps - 1 sets - 2x daily - 7x weekly  Sitting Heel Slide with Towel - 10 reps - 1 sets - 10 hold - 2x daily - 7x weekly

## 2017-12-29 ENCOUNTER — Ambulatory Visit: Payer: BLUE CROSS/BLUE SHIELD | Admitting: Physical Therapy

## 2017-12-29 ENCOUNTER — Encounter: Payer: Self-pay | Admitting: Physical Therapy

## 2017-12-29 DIAGNOSIS — M25662 Stiffness of left knee, not elsewhere classified: Secondary | ICD-10-CM

## 2017-12-29 DIAGNOSIS — R2689 Other abnormalities of gait and mobility: Secondary | ICD-10-CM

## 2017-12-29 DIAGNOSIS — M6281 Muscle weakness (generalized): Secondary | ICD-10-CM

## 2017-12-31 ENCOUNTER — Encounter: Payer: Self-pay | Admitting: Physical Therapy

## 2017-12-31 ENCOUNTER — Ambulatory Visit: Payer: BLUE CROSS/BLUE SHIELD | Admitting: Physical Therapy

## 2017-12-31 DIAGNOSIS — M6281 Muscle weakness (generalized): Secondary | ICD-10-CM

## 2017-12-31 DIAGNOSIS — R2689 Other abnormalities of gait and mobility: Secondary | ICD-10-CM

## 2017-12-31 DIAGNOSIS — M25662 Stiffness of left knee, not elsewhere classified: Secondary | ICD-10-CM

## 2017-12-31 NOTE — Therapy (Signed)
[facility] [address] [place], [place], [address] Phone: [phone]   Fax:  [phone]  Physical Therapy Treatment  Patient Details  Name: Patricia Rose MRN: [id number] Date of Birth: January 26, 1956 Referring Provider (PT): Allean Found, MD   Encounter Date: 12/29/2017  PT End of Session - 12/31/17 0723    Visit Number  12    Number of Visits  17    Date for PT Re-Evaluation  01/15/18    PT Start Time  2947    PT Stop Time  1605    PT Time Calculation (min)  54 min    Equipment Utilized During Treatment  Left knee immobilizer    Activity Tolerance  Patient tolerated treatment well;Patient limited by pain    Behavior During Therapy  [facility] for tasks assessed/performed       History reviewed. No pertinent past medical history.  History reviewed. No pertinent surgical history.  There were no vitals filed for this visit.  Subjective Assessment - 12/31/17 0716    Subjective  Pt. reports no new compliants.  Pt. is able to drive with no limitations.  Pt. remains compliant with use of knee brace.      Limitations  Lifting;Standing;Walking;House hold activities    Diagnostic tests  X-ray    Patient Stated Goals  Increase L knee ROM/ strength    Currently in Pain?  No/denies         TREATMENT  Nustep L5 10 min. (increase flexion)- warm-up/no charge   Therapeutic Exercise: Reviewed HEP BTB seated marches/ hip abd./ LAQ/ standing hip flexion/ abd./ extension 20x each. Recip. Stairs/ BOSU lunges with brace on 10x2. STS 10x2 with no UE assist (mirror feedback for proper wt. Bearing on L).   Manual Therapy: Scar tissue massage/mobilization x35mn Patella glides(all planes)gradeIII Supine/seated L knee AA/PROM flexion with static holds (as tolerated).    PT Long Term Goals - 12/17/17 2029      PT LONG TERM GOAL #1   Title  Pt. will complete FOTO and improve to 56 to improve daily functional mobility.     Baseline  11/19/17 FOTO: 30.  11/13: 48    Time  4    Period  Weeks    Status  Partially Met    Target Date  01/15/18      PT LONG TERM GOAL #2   Title  Pt. will achieve 0 degrees of knee extension on L LE for functional mobility around home.     Baseline  11/19/17 PROM: -6 deg.  11/13: 0 deg. extension in supine position    Time  4    Period  Weeks    Status  Achieved    Target Date  12/17/17      PT LONG TERM GOAL #3   Title  Pt will increase strength of L LE by at least 1/2 MMT grade in order to demonstrate improvement in strength and function     Baseline  Unable to assess at this time due to surgical timeframe and MD protocol.    Time  4    Period  Weeks    Status  On-going    Target Date  01/15/18      PT LONG TERM GOAL #4   Title  Pt. will decrease overall edema of L LE to be WNL when compared to R LE.    Baseline  Minimal L knee joint line swelling noted but moderate L quad muscle atrophy.  Time  4    Period  Weeks    Status  Achieved    Target Date  12/17/17      PT LONG TERM GOAL #5   Title  Pt. will increase L knee flexion to >110 deg. to improve sit to stands/ gait pattern progression outside of brace.      Baseline  Pt. remains hypomobile with L knee flexion AROM (96 deg.)- after manual tx./ cuing. Pt. able to increase L knee flexion to 102 deg. with PROM/ manual holds. Pt. limited by L knee pain    Time  4    Period  Weeks    Status  New    Target Date  01/15/18         Plan - 12/31/17 0724    Clinical Impression Statement  Pt. continues to progress with quad/LE strengthening ex. program.  Slow but consistent progress with L knee flexion in supine/seated posture.  Pt. remains tender over L patella/ knee joint with STM and hand placement during knee stretches.  No increase c/o knee pain.      Clinical Presentation  Evolving    Clinical Decision Making  Moderate    Rehab Potential  Good    PT Frequency  2x / week    PT Duration  4 weeks    PT  Treatment/Interventions  Cryotherapy;Electrical Stimulation;Iontophoresis 1m/ml Dexamethasone;Moist Heat;Traction;Ultrasound;Gait training;Stair training;Functional mobility training;Therapeutic activities;Therapeutic exercise;Manual techniques;Patient/family education;Neuromuscular re-education;Balance training;Scar mobilization;Energy conservation;Dry needling;Passive range of motion    PT Next Visit Plan  Progress treatment per MD protocol.      PT Home Exercise Plan  see notes.       Patient will benefit from skilled therapeutic intervention in order to improve the following deficits and impairments:  Abnormal gait, Decreased endurance, Hypomobility, Impaired sensation, Increased edema, Decreased strength, Decreased activity tolerance, Decreased mobility, Difficulty walking, Decreased range of motion, Impaired flexibility  Visit Diagnosis: Muscle weakness (generalized)  Other abnormalities of gait and mobility  Decreased range of motion (ROM) of left knee     Problem List There are no active problems to display for this patient.  MPura Spice PT, DPT # [id number]/27/2019, 7:27 AM  [facility] [address]Phone: [phone]  Fax:  [phone] Name: Patricia MCCOSHMRN: [id number] of Birth: 102-15-1957

## 2017-12-31 NOTE — Therapy (Addendum)
[facility] [address] [place], [place], [address] Phone: [phone]   Fax:  [phone]  Physical Therapy Treatment  Patient Details  Name: Patricia Rose MRN: [id number] Date of Birth: 03/15/1955 Referring Provider (PT): Alexander Creighton, MD   Encounter Date: 12/31/2017  PT End of Session - 01/05/18 1335    Visit Number  13    Number of Visits  17    Date for PT Re-Evaluation  01/15/18    PT Start Time  1425    PT Stop Time  1518    PT Time Calculation (min)  53 min    Activity Tolerance  Patient tolerated treatment well;Patient limited by pain    Behavior During Therapy  WFL for tasks assessed/performed       History reviewed. No pertinent past medical history.  History reviewed. No pertinent surgical history.  There were no vitals filed for this visit.    Pt. wore brace into PT and removed for PT tx. session. No c/o knee pain and pt. reports good compliance with HEP.     TREATMENT  Nustep L5 10 min. (increase flexion)- warm-up/no charge   Therapeutic Exercise:  Standing hip flexion/ abd./ extension with no UE assist 20x each. TG knee flexion 20x/ heel raises 20x.  Partial lunges in //-bars/ step ups on 6" step 10x2. Tandem gait (forward/backwards)- no UE assist 2nd step L knee flexion (static holds)- 4x Supine ex.: knee to chest with ball/ bridging with ball STS 10x2 with no UE assist (mirror feedback for proper wt. Bearing on L).   Manual Therapy:  Scar tissue massage/mobilization x5min Patella glides(all planes)gradeIII Supine/seated L knee AA/PROM flexion with static holds (as tolerated). L knee flexion 110 deg. After manual tx.      PT Long Term Goals - 12/17/17 2029      PT LONG TERM GOAL #1   Title  Pt. will complete FOTO and improve to 56 to improve daily functional mobility.    Baseline  11/19/17 FOTO: 30.  11/13: 48    Time  4    Period  Weeks    Status  Partially Met     Target Date  01/15/18      PT LONG TERM GOAL #2   Title  Pt. will achieve 0 degrees of knee extension on L LE for functional mobility around home.     Baseline  11/19/17 PROM: -6 deg.  11/13: 0 deg. extension in supine position    Time  4    Period  Weeks    Status  Achieved    Target Date  12/17/17      PT LONG TERM GOAL #3   Title  Pt will increase strength of L LE by at least 1/2 MMT grade in order to demonstrate improvement in strength and function     Baseline  Unable to assess at this time due to surgical timeframe and MD protocol.    Time  4    Period  Weeks    Status  On-going    Target Date  01/15/18      PT LONG TERM GOAL #4   Title  Pt. will decrease overall edema of L LE to be WNL when compared to R LE.    Baseline  Minimal L knee joint line swelling noted but moderate L quad muscle atrophy.      Time  4    Period  Weeks    Status    Achieved    Target Date  12/17/17      PT LONG TERM GOAL #5   Title  Pt. will increase L knee flexion to >110 deg. to improve sit to stands/ gait pattern progression outside of brace.      Baseline  Pt. remains hypomobile with L knee flexion AROM (96 deg.)- after manual tx./ cuing. Pt. able to increase L knee flexion to 102 deg. with PROM/ manual holds. Pt. limited by L knee pain    Time  4    Period  Weeks    Status  New    Target Date  01/15/18        Plan - 01/05/18 1336    Clinical Impression Statement  Pt. ambulates with more normalized gait pattern without use of knee brace/ SPC in PT clinic/ hallway.  Pt. showing slow but consistent progress with L knee flexion (110 deg.) in supine position.  Pt. becoming more independent/confident with LE strengthening ex. program/ progression.  Good motivation with all aspects of PT.      Clinical Presentation  Evolving    Clinical Decision Making  Moderate    Rehab Potential  Good    PT Frequency  2x / week    PT Duration  4 weeks    PT Treatment/Interventions  Cryotherapy;Electrical  Stimulation;Iontophoresis 4mg/ml Dexamethasone;Moist Heat;Traction;Ultrasound;Gait training;Stair training;Functional mobility training;Therapeutic activities;Therapeutic exercise;Manual techniques;Patient/family education;Neuromuscular re-education;Balance training;Scar mobilization;Energy conservation;Dry needling;Passive range of motion    PT Next Visit Plan  Progress treatment per MD protocol.      PT Home Exercise Plan  see notes.       Patient will benefit from skilled therapeutic intervention in order to improve the following deficits and impairments:  Abnormal gait, Decreased endurance, Hypomobility, Impaired sensation, Increased edema, Decreased strength, Decreased activity tolerance, Decreased mobility, Difficulty walking, Decreased range of motion, Impaired flexibility  Visit Diagnosis: Muscle weakness (generalized)  Other abnormalities of gait and mobility  Decreased range of motion (ROM) of left knee     Problem List There are no active problems to display for this patient.  Michael C Sherk, PT, DPT # [id number] 01/05/2018, 1:47 PM  [facility] [address] [place], [place], [address] Phone: [phone]   Fax:  [phone]  Name: Patricia Rose MRN: [id number] Date of Birth: 09/07/1955   

## 2018-01-05 ENCOUNTER — Ambulatory Visit: Payer: BLUE CROSS/BLUE SHIELD | Attending: Orthopedic Surgery | Admitting: Physical Therapy

## 2018-01-05 ENCOUNTER — Encounter: Payer: Self-pay | Admitting: Physical Therapy

## 2018-01-05 DIAGNOSIS — R2689 Other abnormalities of gait and mobility: Secondary | ICD-10-CM

## 2018-01-05 DIAGNOSIS — M6281 Muscle weakness (generalized): Secondary | ICD-10-CM | POA: Diagnosis not present

## 2018-01-05 DIAGNOSIS — M25662 Stiffness of left knee, not elsewhere classified: Secondary | ICD-10-CM

## 2018-01-05 NOTE — Therapy (Signed)
[facility] [address] [place], [place], [address] Phone: [phone]   Fax:  [phone]  Physical Therapy Treatment  Patient Details  Name: Patricia Rose MRN: [id number] Date of Birth: 11/03/1955 Referring Provider (PT): Allean Found, MD   Encounter Date: 01/05/2018  PT End of Session - 01/05/18 1618    Visit Number  14    Number of Visits  17    Date for PT Re-Evaluation  01/15/18    PT Start Time  4356    PT Stop Time  1525    PT Time Calculation (min)  58 min    Activity Tolerance  Patient tolerated treatment well;Patient limited by pain    Behavior During Therapy  [facility] for tasks assessed/performed       History reviewed. No pertinent past medical history.  History reviewed. No pertinent surgical history.  There were no vitals filed for this visit.  Subjective Assessment - 01/05/18 1532    Subjective  Pt. entered PT clinic with use of brace/SPC and states that she is not using SPC at home.  Pt. reports continued improvement with daily walking/ strengthening in L LE.  No pain reported prior to PT tx. session.      Patient is accompained by:  Family member    Limitations  Lifting;Standing;Walking;House hold activities    Diagnostic tests  X-ray    Patient Stated Goals  Increase L knee ROM/ strength    Currently in Pain?  No/denies       TREATMENT  Nustep L5 12 min. (increase flexion)- warm-up/no charge   Therapeutic Exercise:  TM walking: 0.8 to 1.8 mph at 0% incline for 7 min. (working on consistent heel strike/ L knee flexion/ upright posture)- min. To no UE assist on handrails.   TG knee flexion 20x/ heel raises 20x.  Partial lunges in //-bars/ step ups and overs on 3" step 10x2. Blue mat on floor: single leg tall kneeling with L leg up/ return to standing on outside of //-bars.  [place] assist with R UE for safety.   Resisted gait 2BTB forward/ backwards 5x and 1BTB lateral L/R 5x with min. To no UE  assist.  Seated L knee manual isometrics (moderate resistance) flexion/ extension 10x each. Contract-relax to increase L knee flexion.       PT Long Term Goals - 12/17/17 2029      PT LONG TERM GOAL #1   Title  Pt. will complete FOTO and improve to 56 to improve daily functional mobility.    Baseline  11/19/17 FOTO: 30.  11/13: 48    Time  4    Period  Weeks    Status  Partially Met    Target Date  01/15/18      PT LONG TERM GOAL #2   Title  Pt. will achieve 0 degrees of knee extension on L LE for functional mobility around home.     Baseline  11/19/17 PROM: -6 deg.  11/13: 0 deg. extension in supine position    Time  4    Period  Weeks    Status  Achieved    Target Date  12/17/17      PT LONG TERM GOAL #3   Title  Pt will increase strength of L LE by at least 1/2 MMT grade in order to demonstrate improvement in strength and function     Baseline  Unable to assess at this time due to surgical timeframe and MD  protocol.    Time  4    Period  Weeks    Status  On-going    Target Date  01/15/18      PT LONG TERM GOAL #4   Title  Pt. will decrease overall edema of L LE to be WNL when compared to R LE.    Baseline  Minimal L knee joint line swelling noted but moderate L quad muscle atrophy.      Time  4    Period  Weeks    Status  Achieved    Target Date  12/17/17      PT LONG TERM GOAL #5   Title  Pt. will increase L knee flexion to >110 deg. to improve sit to stands/ gait pattern progression outside of brace.      Baseline  Pt. remains hypomobile with L knee flexion AROM (96 deg.)- after manual tx./ cuing. Pt. able to increase L knee flexion to 102 deg. with PROM/ manual holds. Pt. limited by L knee pain    Time  4    Period  Weeks    Status  New    Target Date  01/15/18         Plan - 01/05/18 1619    Clinical Impression Statement  Pt. continues to present with improved walking confidence/ consistent knee flexion/ heel strike in PT clinic with no assistive device.   L knee discomfort/ pain with manual resisted L knee extension/ flexion on blue mat table.  Pt. demonstrates ability to stand up from floor in single leg tall kneeling position with UE/ SPC use.  No change to HEP at this time and PT encouarged pt. to increase walking/ distance.      Clinical Presentation  Evolving    Clinical Decision Making  Moderate    Rehab Potential  Good    PT Frequency  2x / week    PT Duration  4 weeks    PT Treatment/Interventions  Cryotherapy;Electrical Stimulation;Iontophoresis 50m/ml Dexamethasone;Moist Heat;Traction;Ultrasound;Gait training;Stair training;Functional mobility training;Therapeutic activities;Therapeutic exercise;Manual techniques;Patient/family education;Neuromuscular re-education;Balance training;Scar mobilization;Energy conservation;Dry needling;Passive range of motion    PT Next Visit Plan  Progress treatment per MD protocol.  Reassess L knee flexion    PT Home Exercise Plan  see notes.       Patient will benefit from skilled therapeutic intervention in order to improve the following deficits and impairments:  Abnormal gait, Decreased endurance, Hypomobility, Impaired sensation, Increased edema, Decreased strength, Decreased activity tolerance, Decreased mobility, Difficulty walking, Decreased range of motion, Impaired flexibility  Visit Diagnosis: Muscle weakness (generalized)  Other abnormalities of gait and mobility  Decreased range of motion (ROM) of left knee     Problem List There are no active problems to display for this patient.  MPura Spice PT, DPT # [phone]/03/2017, 4:33 PM  [place] [facility] [address]Phone: [phone]  Fax:  [phone] Name: Patricia CARDOSAMRN: [id number] of Birth: 1January 28, 1957

## 2018-01-07 ENCOUNTER — Encounter: Payer: Self-pay | Admitting: Physical Therapy

## 2018-01-07 ENCOUNTER — Ambulatory Visit: Payer: BLUE CROSS/BLUE SHIELD | Admitting: Physical Therapy

## 2018-01-07 DIAGNOSIS — R2689 Other abnormalities of gait and mobility: Secondary | ICD-10-CM

## 2018-01-07 DIAGNOSIS — M6281 Muscle weakness (generalized): Secondary | ICD-10-CM

## 2018-01-07 DIAGNOSIS — M25662 Stiffness of left knee, not elsewhere classified: Secondary | ICD-10-CM

## 2018-01-07 NOTE — Therapy (Addendum)
[facility] [address] [place], [place], [address] Phone: [phone]   Fax:  [phone]  Physical Therapy Treatment  Patient Details  Name: Patricia Rose MRN: [id number] Date of Birth: 11/04/1955 Referring Provider (PT): Allean Found, MD   Encounter Date: 01/07/2018  PT End of Session - 01/07/18 1816    Visit Number  15    Number of Visits  17    Date for PT Re-Evaluation  01/15/18    PT Start Time  6962    PT Stop Time  1516    PT Time Calculation (min)  56 min    Activity Tolerance  Patient tolerated treatment well    Behavior During Therapy  [facility] for tasks assessed/performed       History reviewed. No pertinent past medical history.  History reviewed. No pertinent surgical history.  There were no vitals filed for this visit.  Subjective Assessment - 01/07/18 1814    Subjective  Pt. entered PT with no knee brace today.  Pt. is still using SPC for safety with outside walking.  Pt. reports no pain at rest in L knee.      Limitations  Lifting;Standing;Walking;House hold activities    Diagnostic tests  X-ray    Patient Stated Goals  Increase L knee ROM/ strength    Currently in Pain?  No/denies          TREATMENT  Nustep L5 12 min. (increase flexion)- warm-up/no charge   Therapeutic Exercise:  Scifit L4 12 min. (several reps. To increase L knee flexion/ full rotation)- B UE assist required. Partial lunges in //-bars/ step ups and overs on 3" step 10x each. Resisted gait 2BTB forward/ backwards 10x and 2BTB lateral L/R 5x with min. To no UE assist.  Seated/ standing 4# LE ex. Program: marching/ hip abduction/ knee flexion/ 12" step touches/ seated LAQ/ heel raises 20x each. Supine L SLR 20x/ heel slides 10x.  Manual tx.:  Contract-relax to increase L knee flexion.    Supine quad/hamstring stretches 5x20 sec. Holds on L/R.    PT Long Term Goals - 12/17/17 2029      PT LONG TERM GOAL #1   Title  Pt. will complete FOTO and improve to 56 to improve daily functional mobility.    Baseline  11/19/17 FOTO: 30.  11/13: 48    Time  4    Period  Weeks    Status  Partially Met    Target Date  01/15/18      PT LONG TERM GOAL #2   Title  Pt. will achieve 0 degrees of knee extension on L LE for functional mobility around home.     Baseline  11/19/17 PROM: -6 deg.  11/13: 0 deg. extension in supine position    Time  4    Period  Weeks    Status  Achieved    Target Date  12/17/17      PT LONG TERM GOAL #3   Title  Pt will increase strength of L LE by at least 1/2 MMT grade in order to demonstrate improvement in strength and function     Baseline  Unable to assess at this time due to surgical timeframe and MD protocol.    Time  4    Period  Weeks    Status  On-going    Target Date  01/15/18      PT LONG TERM GOAL #4   Title  Pt. will decrease  overall edema of L LE to be WNL when compared to R LE.    Baseline  Minimal L knee joint line swelling noted but moderate L quad muscle atrophy.      Time  4    Period  Weeks    Status  Achieved    Target Date  12/17/17      PT LONG TERM GOAL #5   Title  Pt. will increase L knee flexion to >110 deg. to improve sit to stands/ gait pattern progression outside of brace.      Baseline  Pt. remains hypomobile with L knee flexion AROM (96 deg.)- after manual tx./ cuing. Pt. able to increase L knee flexion to 102 deg. with PROM/ manual holds. Pt. limited by L knee pain    Time  4    Period  Weeks    Status  New    Target Date  01/15/18         Plan - 01/07/18 1817    Clinical Impression Statement  Pt. presents with 113 deg. L knee flexion after quad/ hamstring stretching in supine position.  Pt. has progressed with 4# ankle wt. LE strengthening ex./ step touches for strengthening with no increase c/o pain.  Fatigue is present with progression of ther.ex. but pt. able to maintain good technique/ LE muscle control.  Few short seated rest  breaks taken.      Clinical Presentation  Evolving    Clinical Decision Making  Moderate    Rehab Potential  Good    PT Frequency  2x / week    PT Duration  4 weeks    PT Treatment/Interventions  Cryotherapy;Electrical Stimulation;Iontophoresis 36m/ml Dexamethasone;Moist Heat;Traction;Ultrasound;Gait training;Stair training;Functional mobility training;Therapeutic activities;Therapeutic exercise;Manual techniques;Patient/family education;Neuromuscular re-education;Balance training;Scar mobilization;Energy conservation;Dry needling;Passive range of motion    PT Next Visit Plan  Progress treatment per MD protocol.  Reassess goals.         Patient will benefit from skilled therapeutic intervention in order to improve the following deficits and impairments:  Abnormal gait, Decreased endurance, Hypomobility, Impaired sensation, Increased edema, Decreased strength, Decreased activity tolerance, Decreased mobility, Difficulty walking, Decreased range of motion, Impaired flexibility  Visit Diagnosis: Muscle weakness (generalized)  Other abnormalities of gait and mobility  Decreased range of motion (ROM) of left knee     Problem List There are no active problems to display for this patient.  MPura Spice PT, DPT # [id number]/05/2017, 6:22 PM  [place] [facility] [place] [address]Phone: [phone]  Fax:  [phone] Name: Patricia ORMISTONMRN: [id number] of Birth: 119-Apr-1957

## 2018-01-12 ENCOUNTER — Ambulatory Visit: Payer: BLUE CROSS/BLUE SHIELD | Admitting: Physical Therapy

## 2018-01-12 DIAGNOSIS — M25662 Stiffness of left knee, not elsewhere classified: Secondary | ICD-10-CM

## 2018-01-12 DIAGNOSIS — R2689 Other abnormalities of gait and mobility: Secondary | ICD-10-CM

## 2018-01-12 DIAGNOSIS — M6281 Muscle weakness (generalized): Secondary | ICD-10-CM

## 2018-01-14 ENCOUNTER — Encounter: Payer: Self-pay | Admitting: Physical Therapy

## 2018-01-14 ENCOUNTER — Ambulatory Visit: Payer: BLUE CROSS/BLUE SHIELD | Admitting: Physical Therapy

## 2018-01-14 DIAGNOSIS — R2689 Other abnormalities of gait and mobility: Secondary | ICD-10-CM

## 2018-01-14 DIAGNOSIS — M6281 Muscle weakness (generalized): Secondary | ICD-10-CM

## 2018-01-14 DIAGNOSIS — M25662 Stiffness of left knee, not elsewhere classified: Secondary | ICD-10-CM

## 2018-01-14 NOTE — Therapy (Signed)
[facility] [address] [place], [place], [address] Phone: [phone]   Fax:  [phone]  Physical Therapy Treatment  Patient Details  Name: Patricia Rose MRN: [id number] Date of Birth: 07/04/1955 Referring Provider (PT): Allean Found, MD   Encounter Date: 01/12/2018  PT End of Session - 01/14/18 0730    Visit Number  16    Number of Visits  17    Date for PT Re-Evaluation  01/15/18    PT Start Time  0623    PT Stop Time  1517    PT Time Calculation (min)  52 min    Activity Tolerance  Patient tolerated treatment well    Behavior During Therapy  [facility] for tasks assessed/performed       History reviewed. No pertinent past medical history.  History reviewed. No pertinent surgical history.  There were no vitals filed for this visit.  Subjective Assessment - 01/14/18 0727    Subjective  Pt. entered PT with no new complaints.  Pt. continues to have L knee "stiffness" with knee bending/ walking.  Pt. has progressed off the knee brace with everyday household tasks.      Limitations  Lifting;Standing;Walking;House hold activities    Diagnostic tests  X-ray    Patient Stated Goals  Increase L knee ROM/ strength    Currently in Pain?  No/denies         TREATMENT  Therapeutic Exercise:  Scifit L4 12 min. (several reps. To increase L knee flexion/ full rotation)- B UE assist required. TG knee flexion/ heel raises 20x (increase L knee static holds with flexion).   Partial lunges in //-bars/ step upsand overson 3"step 10x each.  BOSU lunges with hold 10x each Resisted gait 2BTB forward/ backwards 10x and 2BTB lateral L/R 5x with min. To no UE assist.  Agility ladder tasks (no UE assist) Outside walking on varying terrain/grass and up/down curbs.    Manual tx.:  Contract-relax to increase L knee flexion.L knee PROM: 120 deg. Supine quad/hamstring stretches 2x20 sec. Holds on L/R. L knee patellar mobs. (all  planes)- less sensitive with palpation.      PT Long Term Goals - 12/17/17 2029      PT LONG TERM GOAL #1   Title  Pt. will complete FOTO and improve to 56 to improve daily functional mobility.    Baseline  11/19/17 FOTO: 30.  11/13: 48    Time  4    Period  Weeks    Status  Partially Met    Target Date  01/15/18      PT LONG TERM GOAL #2   Title  Pt. will achieve 0 degrees of knee extension on L LE for functional mobility around home.     Baseline  11/19/17 PROM: -6 deg.  11/13: 0 deg. extension in supine position    Time  4    Period  Weeks    Status  Achieved    Target Date  12/17/17      PT LONG TERM GOAL #3   Title  Pt will increase strength of L LE by at least 1/2 MMT grade in order to demonstrate improvement in strength and function     Baseline  Unable to assess at this time due to surgical timeframe and MD protocol.    Time  4    Period  Weeks    Status  On-going    Target Date  01/15/18  PT LONG TERM GOAL #4   Title  Pt. will decrease overall edema of L LE to be WNL when compared to R LE.    Baseline  Minimal L knee joint line swelling noted but moderate L quad muscle atrophy.      Time  4    Period  Weeks    Status  Achieved    Target Date  12/17/17      PT LONG TERM GOAL #5   Title  Pt. will increase L knee flexion to >110 deg. to improve sit to stands/ gait pattern progression outside of brace.      Baseline  Pt. remains hypomobile with L knee flexion AROM (96 deg.)- after manual tx./ cuing. Pt. able to increase L knee flexion to 102 deg. with PROM/ manual holds. Pt. limited by L knee pain    Time  4    Period  Weeks    Status  New    Target Date  01/15/18         Plan - 01/14/18 0738    Clinical Impression Statement  Marked increase in L knee PROM to 120 deg. flexion (pain limited).  Pt. is becoming less sensitive with palpation/ patellar mobs. to L knee during manual tx. session.  No balance issues noted and PT encouraged pt. to wean off the [facility]  with all indoor/outdoor walking.      Clinical Presentation  Evolving    Clinical Decision Making  Moderate    Rehab Potential  Good    PT Frequency  2x / week    PT Duration  4 weeks    PT Treatment/Interventions  Cryotherapy;Electrical Stimulation;Iontophoresis 36m/ml Dexamethasone;Moist Heat;Traction;Ultrasound;Gait training;Stair training;Functional mobility training;Therapeutic activities;Therapeutic exercise;Manual techniques;Patient/family education;Neuromuscular re-education;Balance training;Scar mobilization;Energy conservation;Dry needling;Passive range of motion    PT Next Visit Plan  Progress treatment per MD protocol.  Reassess goals/schedule next tx. session.      PT Home Exercise Plan  see notes.       Patient will benefit from skilled therapeutic intervention in order to improve the following deficits and impairments:  Abnormal gait, Decreased endurance, Hypomobility, Impaired sensation, Increased edema, Decreased strength, Decreased activity tolerance, Decreased mobility, Difficulty walking, Decreased range of motion, Impaired flexibility  Visit Diagnosis: Muscle weakness (generalized)  Other abnormalities of gait and mobility  Decreased range of motion (ROM) of left knee     Problem List There are no active problems to display for this patient.  MPura Spice PT, DPT # [phone]/12/2017, 7:41 AM  [place] [facility] [address]Phone: [phone]  Fax:  [phone] Name: Patricia FETTERLYMRN: [id number] of Birth: 1Jan 16, 1957

## 2018-01-14 NOTE — Therapy (Signed)
[place] [address]. [place], [place], [address] Phone: [phone]   Fax:  [phone]  Physical Therapy Treatment  Patient Details  Name: Patricia Rose MRN: [id number] Date of Birth: 1955/06/04 Referring Provider (PT): Allean Found, MD   Encounter Date: 01/14/2018  PT End of Session - 01/14/18 1307    Visit Number  17    Number of Visits  17    Date for PT Re-Evaluation  01/15/18    PT Start Time  7017    PT Stop Time  1517    PT Time Calculation (min)  53 min    Activity Tolerance  Patient tolerated treatment well    Behavior During Therapy  [facility] for tasks assessed/performed       History reviewed. No pertinent past medical history.  History reviewed. No pertinent surgical history.  There were no vitals filed for this visit.  Subjective Assessment - 01/14/18 1307    Subjective  Pt. states she has continued stiffness in L knee but doing better overall.      Limitations  Lifting;Standing;Walking;House hold activities    Diagnostic tests  X-ray    Patient Stated Goals  Increase L knee ROM/ strength    Currently in Pain?  No/denies          TREATMENT  Therapeutic Exercise:  Scifit L4 12 min. (several reps. To increase L knee flexion/ full rotation)- B UE assist required. TG knee flexion/ heel raises 20x (increase L knee static holds with flexion).   Partial lunges in //-bars/ step upsand overson 3"step 10x each.  Recip. Stair ascending/ descending 10x Resisted gait 2BTB forward/ backwards10x and2BTB lateral L/R 5x with min. To no UE assist. Supine L hip/knee manual isometrics (mod. To max. Resistance) Outside walking on varying terrain/grass and up/down curbs with no SPC Toe/heel walking Star ex. (SLS with touches)- no UE.    Manual tx.:  Contract-relax to increase L knee flexion.L knee PROM: 120 deg. Supine quad/hamstring stretches 2x20 sec. Holds on L/R. L knee patellar mobs. (all  planes)- less sensitive with palpation.      PT Long Term Goals - 12/17/17 2029      PT LONG TERM GOAL #1   Title  Pt. will complete FOTO and improve to 56 to improve daily functional mobility.    Baseline  11/19/17 FOTO: 30.  11/13: 48    Time  4    Period  Weeks    Status  Partially Met    Target Date  01/15/18      PT LONG TERM GOAL #2   Title  Pt. will achieve 0 degrees of knee extension on L LE for functional mobility around home.     Baseline  11/19/17 PROM: -6 deg.  11/13: 0 deg. extension in supine position    Time  4    Period  Weeks    Status  Achieved    Target Date  12/17/17      PT LONG TERM GOAL #3   Title  Pt will increase strength of L LE by at least 1/2 MMT grade in order to demonstrate improvement in strength and function     Baseline  Unable to assess at this time due to surgical timeframe and MD protocol.    Time  4    Period  Weeks    Status  On-going    Target Date  01/15/18      PT LONG TERM GOAL #4  Title  Pt. will decrease overall edema of L LE to be WNL when compared to R LE.    Baseline  Minimal L knee joint line swelling noted but moderate L quad muscle atrophy.      Time  4    Period  Weeks    Status  Achieved    Target Date  12/17/17      PT LONG TERM GOAL #5   Title  Pt. will increase L knee flexion to >110 deg. to improve sit to stands/ gait pattern progression outside of brace.      Baseline  Pt. remains hypomobile with L knee flexion AROM (96 deg.)- after manual tx./ cuing. Pt. able to increase L knee flexion to 102 deg. with PROM/ manual holds. Pt. limited by L knee pain    Time  4    Period  Weeks    Status  New    Target Date  01/15/18            Plan - 01/14/18 1308    Clinical Impression Statement  Pt. demonstrates consistent reciprocal pattern with ascending/ descending stairs with minimal to no UT assist.  Pt. showing progress with L knee flexion which has allowed for improved gait pattern/ ther.ex./ use of Scifit.  Pt.  progressing off the [facility] with all aspects of walking.      Clinical Presentation  Evolving    Clinical Decision Making  Moderate    Rehab Potential  Good    PT Frequency  2x / week    PT Duration  4 weeks    PT Treatment/Interventions  Cryotherapy;Electrical Stimulation;Iontophoresis 58m/ml Dexamethasone;Moist Heat;Traction;Ultrasound;Gait training;Stair training;Functional mobility training;Therapeutic activities;Therapeutic exercise;Manual techniques;Patient/family education;Neuromuscular re-education;Balance training;Scar mobilization;Energy conservation;Dry needling;Passive range of motion    PT Next Visit Plan  Progress treatment per MD protocol.  Reassess goals/schedule next tx. session.         Patient will benefit from skilled therapeutic intervention in order to improve the following deficits and impairments:  Abnormal gait, Decreased endurance, Hypomobility, Impaired sensation, Increased edema, Decreased strength, Decreased activity tolerance, Decreased mobility, Difficulty walking, Decreased range of motion, Impaired flexibility  Visit Diagnosis: Muscle weakness (generalized)  Other abnormalities of gait and mobility  Decreased range of motion (ROM) of left knee     Problem List There are no active problems to display for this patient.  MPura Spice PT, DPT # [phone]/12/2017, 6:08 PM  [place] [facility]MNorco [place] [address]Phone: [phone]  Fax:  [phone] Name: Patricia HOWTONMRN: [id number] of Birth: 104/12/1955

## 2018-01-19 ENCOUNTER — Encounter: Payer: Self-pay | Admitting: Physical Therapy

## 2018-01-19 ENCOUNTER — Ambulatory Visit: Payer: BLUE CROSS/BLUE SHIELD | Admitting: Physical Therapy

## 2018-01-19 DIAGNOSIS — R2689 Other abnormalities of gait and mobility: Secondary | ICD-10-CM

## 2018-01-19 DIAGNOSIS — M6281 Muscle weakness (generalized): Secondary | ICD-10-CM | POA: Diagnosis not present

## 2018-01-19 DIAGNOSIS — M25662 Stiffness of left knee, not elsewhere classified: Secondary | ICD-10-CM

## 2018-01-21 ENCOUNTER — Ambulatory Visit: Payer: BLUE CROSS/BLUE SHIELD | Admitting: Physical Therapy

## 2018-01-21 ENCOUNTER — Encounter: Payer: Self-pay | Admitting: Physical Therapy

## 2018-01-21 DIAGNOSIS — M6281 Muscle weakness (generalized): Secondary | ICD-10-CM | POA: Diagnosis not present

## 2018-01-21 DIAGNOSIS — M25662 Stiffness of left knee, not elsewhere classified: Secondary | ICD-10-CM

## 2018-01-21 DIAGNOSIS — R2689 Other abnormalities of gait and mobility: Secondary | ICD-10-CM

## 2018-01-21 NOTE — Therapy (Signed)
[facility] [address]. [place], [place], [address] Phone: [phone]   Fax:  [phone]  Physical Therapy Treatment  Patient Details  Name: Patricia Rose MRN: [id number] Date of Birth: November 08, 1955 Referring Provider (PT): Allean Found, MD   Encounter Date: 01/19/2018    Treatment: 18 of 25.  Recert date: 1/32/44 0102 to 1517   History reviewed. No pertinent past medical history.  History reviewed. No pertinent surgical history.  There were no vitals filed for this visit.  Subjective Assessment - 01/21/18 0713    Subjective  Pt. reports no new complaints.      Patient is accompained by:  Family member    Limitations  Lifting;Standing;Walking;House hold activities    Diagnostic tests  X-ray    Patient Stated Goals  Increase L knee ROM/ strength    Currently in Pain?  No/denies         [facility] PT Assessment - 01/21/18 0001      Assessment   Medical Diagnosis  L Patellar ORIF    Referring Provider (PT)  Allean Found, MD    Onset Date/Surgical Date  11/11/17    Prior Therapy  no      Balance Screen   Has the patient fallen in the past 6 months  No      Prior Function   Level of Independence  Independent         TREATMENT  Therapeutic Exercise:  Scifit L6 12 min. (several reps. To increase L knee flexion/ full rotation)- B UE assist required. TG knee flexion/ heel raises 20x (increase L knee static holds with flexion). Sled push/pull 70# 3x in hallway Partial lunges in //-bars/ step upsand overson 3"step 10x each.Recip. Stair ascending/ descending 10x Resisted gait 2BTB forward/ backwards10x and2BTB lateral L/R 5x with min. To no UE assist. Supine L hip/knee manual isometrics (mod. To max. Resistance) Outside walking on varying terrain/grass and up/down curbs with no SPC.  Ascending outside stairs with use of SPC only and recip. Pattern (slight L knee discomfort with eccentric  control).  Toe/heel walking Braiding 3x in //-bars 10# floor to waist box lifting/ B carry   Manual tx.:  Supine quad/hamstring stretches2x20 sec. Holds on L/R.  See goals.   L knee patellar mobs. (all planes)- less sensitive with palpation.   PT Long Term Goals - 01/19/18 1455      PT LONG TERM GOAL #1   Title  Pt. will complete FOTO and improve to 56 to improve daily functional mobility.    Baseline  11/19/17 FOTO: 30.  11/13: 48.  12/16: 72.      Time  4    Period  Weeks    Status  Achieved    Target Date  01/19/18      PT LONG TERM GOAL #2   Title  Pt. will achieve 0 degrees of knee extension on L LE for functional mobility around home.     Baseline  11/19/17 PROM: -6 deg.  11/13: 0 deg. extension in supine position    Time  4    Period  Weeks    Status  Achieved    Target Date  12/17/17      PT LONG TERM GOAL #3   Title  Pt will increase strength of L LE by at least 1/2 MMT grade in order to demonstrate improvement in strength and function     Baseline  R LE muscle strength 5/5 MMT.  L  quad/hamstring 4/5 MMT and hip flexion 4-/5 MMT    Time  4    Period  Weeks    Status  Partially Met    Target Date  02/16/18      PT LONG TERM GOAL #4   Title  Pt. will decrease overall edema of L LE to be WNL when compared to R LE.    Baseline  Improvement noted in L quad muscle activation.      Time  4    Period  Weeks    Status  Achieved    Target Date  12/17/17      PT LONG TERM GOAL #5   Title  Pt. will increase L knee flexion to >110 deg. to improve sit to stands/ gait pattern progression outside of brace.      Baseline  L knee 116 deg. AROM/ 121 deg. PROM.     Time  4    Period  Weeks    Status  Achieved    Target Date  01/19/18      Additional Long Term Goals   Additional Long Term Goals  Yes      PT LONG TERM GOAL #6   Title  Pt. able to return to work with no L knee pain or limitations during a full work day.      Baseline  pt. has not yet been able to  return to work.      Time  4    Period  Weeks    Status  New    Target Date  02/16/18         Plan - 01/21/18 0716    Clinical Impression Statement  Pt. continues to progress with LE muscle strengthening and independence with gait.  Pt. ambulates with more normalized gait pattern without use of SPC on level surfaces.  Slight increase in L knee stiffness/ discomfort during eccentric muscle control while descending stairs outside with 1 handrail.  See updated goals.  Pt. will benefit from continued PT to focus on L quad/ LE muscle strengthening to promote return to work.      Clinical Presentation  Evolving    Clinical Decision Making  Moderate    Rehab Potential  Good    PT Frequency  2x / week    PT Duration  4 weeks    PT Treatment/Interventions  Cryotherapy;Electrical Stimulation;Iontophoresis 55m/ml Dexamethasone;Moist Heat;Traction;Ultrasound;Gait training;Stair training;Functional mobility training;Therapeutic activities;Therapeutic exercise;Manual techniques;Patient/family education;Neuromuscular re-education;Balance training;Scar mobilization;Energy conservation;Dry needling;Passive range of motion    PT Next Visit Plan  Progress treatment per MD protocol.  Check schedule next tx. session.         Patient will benefit from skilled therapeutic intervention in order to improve the following deficits and impairments:  Abnormal gait, Decreased endurance, Hypomobility, Impaired sensation, Increased edema, Decreased strength, Decreased activity tolerance, Decreased mobility, Difficulty walking, Decreased range of motion, Impaired flexibility  Visit Diagnosis: Muscle weakness (generalized)  Other abnormalities of gait and mobility  Decreased range of motion (ROM) of left knee     Problem List There are no active problems to display for this patient.  MPura Spice PT, DPT # [phone]/18/2019, 7:26 AM  [place] [facility] StreetMCateechee N[place] [address]Phone: [phone]  Fax:  [phone] Name: Patricia SHAMPINEMRN: [id number] of Birth: 105/06/1955

## 2018-01-21 NOTE — Therapy (Signed)
[place] [facility] [address] [place], [place], [address] Phone: [phone]   Fax:  [phone]  Physical Therapy Treatment  Patient Details  Name: Patricia Rose MRN: [id number] Date of Birth: 10-01-1955 Referring Provider (PT): Allean Found, MD   Encounter Date: 01/21/2018  PT End of Session - 01/21/18 1432    Visit Number  19    Number of Visits  25    Date for PT Re-Evaluation  02/16/18    PT Start Time  5053    PT Stop Time  1515    PT Time Calculation (min)  47 min    Activity Tolerance  Patient tolerated treatment well    Behavior During Therapy  [facility] for tasks assessed/performed       History reviewed. No pertinent past medical history.  History reviewed. No pertinent surgical history.  There were no vitals filed for this visit.  Subjective Assessment - 01/21/18 1430    Subjective  Pt. reports persistent "tightness" in L knee joint with walking.  Pt. states that "the more I walk the tighter the left knee gets."  Pt. reports no pain in L knee at this time and entered PT with use of SPC.      Limitations  Lifting;Standing;Walking;House hold activities    Diagnostic tests  X-ray    Patient Stated Goals  Increase L knee ROM/ strength    Currently in Pain?  No/denies         TREATMENT  Therapeutic Exercise:  Scifit L6 12 min. (several reps. To increase L knee flexion/ full rotation)- No UE assist for final 3 min.  TG knee flexion/ heel raises 20x (increase L knee static holds with flexion). Single leg L knee squats 12x (fatigue noted/ difficult) Supine ball ex.:  Knee to chest 20x/ bridging 20x/ bridging with added knee flexion 10x.   Resisted gait with Nautilus: 80# 2BTB forward/ backwards10x Braiding in //-bars with no UE assist (increase cadence today)- L/R 2x 20# floor to waist box lifting/ carrying around PT gym  Discussed HEP/ weaning off the [facility]    PT Long Term Goals - 01/19/18 1455      PT  LONG TERM GOAL #1   Title  Pt. will complete FOTO and improve to 56 to improve daily functional mobility.    Baseline  11/19/17 FOTO: 30.  11/13: 48.  12/16: 72.      Time  4    Period  Weeks    Status  Achieved    Target Date  01/19/18      PT LONG TERM GOAL #2   Title  Pt. will achieve 0 degrees of knee extension on L LE for functional mobility around home.     Baseline  11/19/17 PROM: -6 deg.  11/13: 0 deg. extension in supine position    Time  4    Period  Weeks    Status  Achieved    Target Date  12/17/17      PT LONG TERM GOAL #3   Title  Pt will increase strength of L LE by at least 1/2 MMT grade in order to demonstrate improvement in strength and function     Baseline  R LE muscle strength 5/5 MMT.  L quad/hamstring 4/5 MMT and hip flexion 4-/5 MMT    Time  4    Period  Weeks    Status  Partially Met    Target Date  02/16/18  PT LONG TERM GOAL #4   Title  Pt. will decrease overall edema of L LE to be WNL when compared to R LE.    Baseline  Improvement noted in L quad muscle activation.      Time  4    Period  Weeks    Status  Achieved    Target Date  12/17/17      PT LONG TERM GOAL #5   Title  Pt. will increase L knee flexion to >110 deg. to improve sit to stands/ gait pattern progression outside of brace.      Baseline  L knee 116 deg. AROM/ 121 deg. PROM.     Time  4    Period  Weeks    Status  Achieved    Target Date  01/19/18      Additional Long Term Goals   Additional Long Term Goals  Yes      PT LONG TERM GOAL #6   Title  Pt. able to return to work with no L knee pain or limitations during a full work day.      Baseline  pt. has not yet been able to return to work.      Time  4    Period  Weeks    Status  New    Target Date  02/16/18          Plan - 01/21/18 2020    Clinical Impression Statement  Pt. instructed to discharge use of SPC with all aspects of walking/ mobility.  Pt. ascending/ descending stairs with recip. pattern and more  control/confidence with eccentric L quad control.  Good balance and progression with L quad/ LE muscle strengthening with higher level ex.     Clinical Presentation  Evolving    Clinical Decision Making  Moderate    Rehab Potential  Good    PT Frequency  2x / week    PT Duration  4 weeks    PT Treatment/Interventions  Cryotherapy;Electrical Stimulation;Iontophoresis 106m/ml Dexamethasone;Moist Heat;Traction;Ultrasound;Gait training;Stair training;Functional mobility training;Therapeutic activities;Therapeutic exercise;Manual techniques;Patient/family education;Neuromuscular re-education;Balance training;Scar mobilization;Energy conservation;Dry needling;Passive range of motion    PT Next Visit Plan  Progress treatment per MD protocol.  Check schedule next tx. session.      PT Home Exercise Plan  see notes.       Patient will benefit from skilled therapeutic intervention in order to improve the following deficits and impairments:  Abnormal gait, Decreased endurance, Hypomobility, Impaired sensation, Increased edema, Decreased strength, Decreased activity tolerance, Decreased mobility, Difficulty walking, Decreased range of motion, Impaired flexibility  Visit Diagnosis: Muscle weakness (generalized)  Other abnormalities of gait and mobility  Decreased range of motion (ROM) of left knee     Problem List There are no active problems to display for this patient.  MPura Spice PT, DPT # [phone]/18/2019, 8:25 PM  [place] [facility] [place] [address]Phone: [phone]  Fax:  [phone] Name: Patricia MESSMANMRN: [id number] of Birth: [birth year]

## 2018-01-26 ENCOUNTER — Ambulatory Visit: Payer: BLUE CROSS/BLUE SHIELD | Admitting: Physical Therapy

## 2018-01-26 ENCOUNTER — Encounter: Payer: Self-pay | Admitting: Physical Therapy

## 2018-01-26 DIAGNOSIS — M25662 Stiffness of left knee, not elsewhere classified: Secondary | ICD-10-CM

## 2018-01-26 DIAGNOSIS — M6281 Muscle weakness (generalized): Secondary | ICD-10-CM | POA: Diagnosis not present

## 2018-01-26 DIAGNOSIS — R2689 Other abnormalities of gait and mobility: Secondary | ICD-10-CM

## 2018-01-28 NOTE — Therapy (Signed)
[place] [facility] [address]. [place], [place], [address] Phone: [phone]   Fax:  [phone]  Physical Therapy Treatment  Patient Details  Name: Patricia Rose MRN: [id number] Date of Birth: 05/04/1955 Referring Provider (PT): Allean Found, MD   Encounter Date: 01/26/2018    PT End of Session - 01/26/18     Visit Number  20    Number of Visits  25    Date for PT Re-Evaluation  02/16/18    PT Start Time  1430    PT Stop Time  1515    PT Time Calculation (min)  45 min    Activity Tolerance  Patient tolerated treatment well    Behavior During Therapy  [facility] for tasks assessed/performed     History reviewed. No pertinent past medical history.  History reviewed. No pertinent surgical history.  There were no vitals filed for this visit.     Subjective Assessment - 01/26/18    Subjective Patient states there are no significant changes since her last visit. Patient reports still having tightness in her L knee when she is walking.   Limitations  Lifting;Standing;Walking;House hold activities    Diagnostic tests  X-ray    Patient Stated Goals  Increase L knee ROM/ strength    Currently in Pain?  Yes; 3/10; L knee; Tender         TREATMENT   Therapeutic Exercise:   Scifit L6 12 min. (several reps. To increase L knee flexion/ full rotation)- No UE assist for final 3 min.  TG knee flexion/ heel raises 20x (increase L knee static holds with flexion).  Single leg L knee squats 12x    Supine ball ex.:  Knee to chest 20x/bridging with added knee flexion 10x.   Bridging x20 B Stance (L bias) bridging x10 (pt noted increased stiffness after) Resisted gait with Nautilus: 80# 2BTB forward/ backwards 10x Braiding in //-bars with no UE assist (increase cadence today)- L/R 4x  Patient educated on exercise technique throughout the session. Patient also educated on current POC.    PT Long Term Goals - 01/19/18 1455       PT LONG TERM GOAL #1   Title  Pt. will complete FOTO and improve to 56 to improve daily functional mobility.    Baseline  11/19/17 FOTO: 30.  11/13: 48.  12/16: 72.      Time  4    Period  Weeks    Status  Achieved    Target Date  01/19/18      PT LONG TERM GOAL #2   Title  Pt. will achieve 0 degrees of knee extension on L LE for functional mobility around home.     Baseline  11/19/17 PROM: -6 deg.  11/13: 0 deg. extension in supine position    Time  4    Period  Weeks    Status  Achieved    Target Date  12/17/17      PT LONG TERM GOAL #3   Title  Pt will increase strength of L LE by at least 1/2 MMT grade in order to demonstrate improvement in strength and function     Baseline  R LE muscle strength 5/5 MMT.  L quad/hamstring 4/5 MMT and hip flexion 4-/5 MMT    Time  4    Period  Weeks    Status  Partially Met    Target Date  02/16/18      PT LONG TERM  GOAL #4   Title  Pt. will decrease overall edema of L LE to be WNL when compared to R LE.    Baseline  Improvement noted in L quad muscle activation.      Time  4    Period  Weeks    Status  Achieved    Target Date  12/17/17      PT LONG TERM GOAL #5   Title  Pt. will increase L knee flexion to >110 deg. to improve sit to stands/ gait pattern progression outside of brace.      Baseline  L knee 116 deg. AROM/ 121 deg. PROM.     Time  4    Period  Weeks    Status  Achieved    Target Date  01/19/18      Additional Long Term Goals   Additional Long Term Goals  Yes      PT LONG TERM GOAL #6   Title  Pt. able to return to work with no L knee pain or limitations during a full work day.      Baseline  pt. has not yet been able to return to work.      Time  4    Period  Weeks    Status  New    Target Date  02/16/18            Plan - 01/28/18 1644    Clinical Impression Statement  Patient continues to progress with tolerance to activity demands and is able to ambulate without AD. Patient continues to demonstrate  increased pain with loaded end range knee flexion and deficits in strength evident during knee and hip extension. Patient will continue to benefit from skilled therapeutic intervention to address remaining deficits in strength, functional ROM, and overall mobility in order to return to PLOF.     Rehab Potential  Good    PT Frequency  2x / week    PT Duration  4 weeks    PT Treatment/Interventions  Cryotherapy;Electrical Stimulation;Iontophoresis 36m/ml Dexamethasone;Moist Heat;Traction;Ultrasound;Gait training;Stair training;Functional mobility training;Therapeutic activities;Therapeutic exercise;Manual techniques;Patient/family education;Neuromuscular re-education;Balance training;Scar mobilization;Energy conservation;Dry needling;Passive range of motion    PT Next Visit Plan  Progress treatment per MD protocol.  Check schedule next tx. session.      PT Home Exercise Plan  see notes.       Patient will benefit from skilled therapeutic intervention in order to improve the following deficits and impairments:  Abnormal gait, Decreased endurance, Hypomobility, Impaired sensation, Increased edema, Decreased strength, Decreased activity tolerance, Decreased mobility, Difficulty walking, Decreased range of motion, Impaired flexibility  Visit Diagnosis: Muscle weakness (generalized)  Other abnormalities of gait and mobility  Decreased range of motion (ROM) of left knee     Problem List There are no active problems to display for this patient.  KMyles GipPT, DPT #[phone]/25/2019, 4:47 PM  [place] [facility] CenterMT[facility] [address]Phone: [phone]  Fax:  [phone] Name: Patricia DURKINMRN: [id number] of Birth: [birth year]

## 2018-02-05 ENCOUNTER — Ambulatory Visit: Payer: BLUE CROSS/BLUE SHIELD | Attending: Orthopedic Surgery | Admitting: Physical Therapy

## 2018-02-05 ENCOUNTER — Encounter: Payer: Self-pay | Admitting: Physical Therapy

## 2018-02-05 DIAGNOSIS — M25662 Stiffness of left knee, not elsewhere classified: Secondary | ICD-10-CM

## 2018-02-05 DIAGNOSIS — M6281 Muscle weakness (generalized): Secondary | ICD-10-CM | POA: Diagnosis present

## 2018-02-05 DIAGNOSIS — R2689 Other abnormalities of gait and mobility: Secondary | ICD-10-CM | POA: Insufficient documentation

## 2018-02-06 NOTE — Therapy (Addendum)
[facility] [address] [place], [place], [address] Phone: [phone]   Fax:  [phone]  Physical Therapy Treatment  Patient Details  Name: Patricia Rose MRN: [id number] Date of Birth: 05-08-1955 Referring Provider (PT): Allean Found, MD   Encounter Date: 02/05/2018  PT End of Session - 02/10/18 1045    Visit Number  21    Number of Visits  25    Date for PT Re-Evaluation  02/16/18    PT Start Time  2595    PT Stop Time  6387    PT Time Calculation (min)  52 min    Activity Tolerance  Patient tolerated treatment well    Behavior During Therapy  [facility] for tasks assessed/performed       History reviewed. No pertinent past medical history.  History reviewed. No pertinent surgical history.  There were no vitals filed for this visit.    No pain or issues reported. Pt. was active shopping the past 4 days. Pt. returns to MD on 02/20/18.          TREATMENT  Therapeutic Exercise:  Scifit L612 min. (warm-up/no charge) TG knee flexion/ heel raises 20x (increase L knee static holds with flexion). Single leg L knee squats 10x2    Supine LE stretches (hamstring/ knee to chest/ quad)- 3x each. Supine bridging with added marching x20 Supine 2.5# SLR/ LAQ/ standing hip flexion/ extension/ knee flexion 20x each.  Resisted gaitwith Nautilus: 80#2BTB forward/ backwards10x 6" step ups/ touches at stairs with min. To no UE assist BOSU lunges/ step ups  Discussed HEP    PT Long Term Goals - 01/19/18 1455      PT LONG TERM GOAL #1   Title  Pt. will complete FOTO and improve to 56 to improve daily functional mobility.    Baseline  11/19/17 FOTO: 30.  11/13: 48.  12/16: 72.      Time  4    Period  Weeks    Status  Achieved    Target Date  01/19/18      PT LONG TERM GOAL #2   Title  Pt. will achieve 0 degrees of knee extension on L LE for functional mobility around home.     Baseline  11/19/17 PROM: -6  deg.  11/13: 0 deg. extension in supine position    Time  4    Period  Weeks    Status  Achieved    Target Date  12/17/17      PT LONG TERM GOAL #3   Title  Pt will increase strength of L LE by at least 1/2 MMT grade in order to demonstrate improvement in strength and function     Baseline  R LE muscle strength 5/5 MMT.  L quad/hamstring 4/5 MMT and hip flexion 4-/5 MMT    Time  4    Period  Weeks    Status  Partially Met    Target Date  02/16/18      PT LONG TERM GOAL #4   Title  Pt. will decrease overall edema of L LE to be WNL when compared to R LE.    Baseline  Improvement noted in L quad muscle activation.      Time  4    Period  Weeks    Status  Achieved    Target Date  12/17/17      PT LONG TERM GOAL #5   Title  Pt. will increase L knee  flexion to >110 deg. to improve sit to stands/ gait pattern progression outside of brace.      Baseline  L knee 116 deg. AROM/ 121 deg. PROM.     Time  4    Period  Weeks    Status  Achieved    Target Date  01/19/18      Additional Long Term Goals   Additional Long Term Goals  Yes      PT LONG TERM GOAL #6   Title  Pt. able to return to work with no L knee pain or limitations during a full work day.      Baseline  pt. has not yet been able to return to work.      Time  4    Period  Weeks    Status  New    Target Date  02/16/18          Plan - 02/10/18 1046    Clinical Impression Statement  Pt. continues to progress with all aspects of LE strengthening and ambulating with no assistive device safely.  No LLD noted and PT reassessment of hamstring/ knee flexibility WNL.  Pt. returns to MD in a couple weeks with plan to RTW with no limitations.      Clinical Presentation  Evolving    Clinical Decision Making  Moderate    Rehab Potential  Good    PT Frequency  2x / week    PT Duration  4 weeks    PT Treatment/Interventions  Cryotherapy;Electrical Stimulation;Iontophoresis 53m/ml Dexamethasone;Moist Heat;Traction;Ultrasound;Gait  training;Stair training;Functional mobility training;Therapeutic activities;Therapeutic exercise;Manual techniques;Patient/family education;Neuromuscular re-education;Balance training;Scar mobilization;Energy conservation;Dry needling;Passive range of motion    PT Next Visit Plan  Progress treatment per MD protocol.  Check schedule next tx. session.         Patient will benefit from skilled therapeutic intervention in order to improve the following deficits and impairments:  Abnormal gait, Decreased endurance, Hypomobility, Impaired sensation, Increased edema, Decreased strength, Decreased activity tolerance, Decreased mobility, Difficulty walking, Decreased range of motion, Impaired flexibility  Visit Diagnosis: Muscle weakness (generalized)  Other abnormalities of gait and mobility  Decreased range of motion (ROM) of left knee     Problem List There are no active problems to display for this patient.  MPura Spice PT, DPT # [phone]/08/2018, 10:48 AM  [place] [facility]MNew [address] [place] [address]Phone: [phone]  Fax:  [phone] Name: Patricia FODGEMRN: [id number] of Birth: 108/04/1955

## 2018-02-11 ENCOUNTER — Encounter: Payer: Self-pay | Admitting: Physical Therapy

## 2018-02-11 ENCOUNTER — Ambulatory Visit: Payer: BLUE CROSS/BLUE SHIELD | Admitting: Physical Therapy

## 2018-02-11 DIAGNOSIS — M6281 Muscle weakness (generalized): Secondary | ICD-10-CM

## 2018-02-11 DIAGNOSIS — M25662 Stiffness of left knee, not elsewhere classified: Secondary | ICD-10-CM

## 2018-02-11 DIAGNOSIS — R2689 Other abnormalities of gait and mobility: Secondary | ICD-10-CM

## 2018-02-11 NOTE — Therapy (Signed)
[facility] [address] [place], [place], [address] Phone: [phone]   Fax:  [phone]  Physical Therapy Treatment  Patient Details  Name: Patricia Rose MRN: [id number] Date of Birth: Jun 12, 1955 Referring Provider (PT): Allean Found, MD   Encounter Date: 02/11/2018  PT End of Session - 02/11/18 1259    Visit Number  22    Number of Visits  25    Date for PT Re-Evaluation  02/16/18    PT Start Time  1257    PT Stop Time  1352    PT Time Calculation (min)  55 min    Activity Tolerance  Patient tolerated treatment well    Behavior During Therapy  [facility] for tasks assessed/performed       History reviewed. No pertinent past medical history.  History reviewed. No pertinent surgical history.  There were no vitals filed for this visit.  Subjective Assessment - 02/11/18 1258    Subjective  Pt. reports feeling "band" in L knee with prolonged walking.  Pt. [facility] at community center (25 laps).  Pt. returns to MD next Friday (02/20/18)    Limitations  Lifting;Standing;Walking;House hold activities    Diagnostic tests  X-ray    Patient Stated Goals  Increase L knee ROM/ strength    Currently in Pain?  No/denies            TREATMENT  Therapeutic Exercise:  Scifit L610 min. (warm-up/no charge) BOSU lunges x10 in //-bars with occasional UE assist needed to prevent LOB BOSU step up with 3 sec balance at top x10 Reverse BOSU squats x12 (difficulty noted)- posterior lean Resisted cable walk outs in all 4 directions x5 (80#)  TG bilateral squats 10x. Single leg squats 2x10 Recip. stair climbing x4 without use of hands or handrails.  Focus on L quad eccentric control. Supine L hip/knee generalized stretches/ patella mobility reassessment.     Discussed HEP     PT Long Term Goals - 01/19/18 1455      PT LONG TERM GOAL #1   Title  Pt. will complete FOTO and improve to 56 to improve daily functional  mobility.    Baseline  11/19/17 FOTO: 30.  11/13: 48.  12/16: 72.      Time  4    Period  Weeks    Status  Achieved    Target Date  01/19/18      PT LONG TERM GOAL #2   Title  Pt. will achieve 0 degrees of knee extension on L LE for functional mobility around home.     Baseline  11/19/17 PROM: -6 deg.  11/13: 0 deg. extension in supine position    Time  4    Period  Weeks    Status  Achieved    Target Date  12/17/17      PT LONG TERM GOAL #3   Title  Pt will increase strength of L LE by at least 1/2 MMT grade in order to demonstrate improvement in strength and function     Baseline  R LE muscle strength 5/5 MMT.  L quad/hamstring 4/5 MMT and hip flexion 4-/5 MMT    Time  4    Period  Weeks    Status  Partially Met    Target Date  02/16/18      PT LONG TERM GOAL #4   Title  Pt. will decrease overall edema of L LE to be WNL when compared to R  LE.    Baseline  Improvement noted in L quad muscle activation.      Time  4    Period  Weeks    Status  Achieved    Target Date  12/17/17      PT LONG TERM GOAL #5   Title  Pt. will increase L knee flexion to >110 deg. to improve sit to stands/ gait pattern progression outside of brace.      Baseline  L knee 116 deg. AROM/ 121 deg. PROM.     Time  4    Period  Weeks    Status  Achieved    Target Date  01/19/18      Additional Long Term Goals   Additional Long Term Goals  Yes      PT LONG TERM GOAL #6   Title  Pt. able to return to work with no L knee pain or limitations during a full work day.      Baseline  pt. has not yet been able to return to work.      Time  4    Period  Weeks    Status  New    Target Date  02/16/18         Plan - 02/11/18 1259    Clinical Impression Statement  Pt. needed minimal verbal cueing for maintaining heel strike during gait pattern in gym.  PT will continue to advance pt. toward goals with eccentric knee training.  Pt. completed bilateral recipical gait on stairs but with slight external  rotation on left leg.  Slight hypomobility of left patella as compared to R.  No increase c/o pain t/o tx. and progressing well toward goals.  Pt. has 2 more visits before MD f/u visit.      Clinical Presentation  Stable    Clinical Decision Making  Low    Rehab Potential  Good    PT Frequency  2x / week    PT Duration  4 weeks    PT Treatment/Interventions  Cryotherapy;Electrical Stimulation;Iontophoresis 107m/ml Dexamethasone;Moist Heat;Traction;Ultrasound;Gait training;Stair training;Functional mobility training;Therapeutic activities;Therapeutic exercise;Manual techniques;Patient/family education;Neuromuscular re-education;Balance training;Scar mobilization;Energy conservation;Dry needling;Passive range of motion    PT Next Visit Plan  Progress treatment per MD protocol.  2 more visits schedule.      PT Home Exercise Plan  see notes.    Consulted and Agree with Plan of Care  Patient;Family member/caregiver       Patient will benefit from skilled therapeutic intervention in order to improve the following deficits and impairments:  Abnormal gait, Decreased endurance, Hypomobility, Impaired sensation, Increased edema, Decreased strength, Decreased activity tolerance, Decreased mobility, Difficulty walking, Decreased range of motion, Impaired flexibility  Visit Diagnosis: Muscle weakness (generalized)  Other abnormalities of gait and mobility  Decreased range of motion (ROM) of left knee     Problem List There are no active problems to display for this patient.   MPura Spice PT, DPT # [phone]/09/2018, 2:06 PM  [place] [facility] [address]Phone: [phone]  Fax:  [phone] Name: Patricia LAGOYMRN: [id number] of Birth: 1Sep 03, 1957

## 2018-02-13 ENCOUNTER — Ambulatory Visit: Payer: BLUE CROSS/BLUE SHIELD | Admitting: Physical Therapy

## 2018-02-13 ENCOUNTER — Encounter: Payer: Self-pay | Admitting: Physical Therapy

## 2018-02-13 DIAGNOSIS — M6281 Muscle weakness (generalized): Secondary | ICD-10-CM

## 2018-02-13 DIAGNOSIS — M25662 Stiffness of left knee, not elsewhere classified: Secondary | ICD-10-CM

## 2018-02-13 DIAGNOSIS — R2689 Other abnormalities of gait and mobility: Secondary | ICD-10-CM

## 2018-02-13 NOTE — Therapy (Addendum)
[facility] [address]. [place], [place], [address] Phone: [phone]   Fax:  [phone]  Physical Therapy Treatment  Patient Details  Name: Patricia Rose MRN: [id number] Date of Birth: 05-30-1955 Referring Provider (PT): Allean Found, MD   Encounter Date: 02/13/2018    Treatment: 23 of 25.  Recert date: 8/67/6720 1249 to 1340   History reviewed. No pertinent past medical history.  History reviewed. No pertinent surgical history.  There were no vitals filed for this visit.      Pt. states she is doing well. Pt. walked 24 laps at [facility] this morning. No pain.      TREATMENT  Therapeutic Exercise:  TM 2.0-2.2 mph for 5 min. (gait assessment for BOS/ step pattern/ heel strike) TG bilateral squats 10x. Single leg squats 2x10 BOSU lunges x10 in //-bars with occasional UE assist needed to prevent LOB BOSU step up with 3 sec balance at top x10 (mirror feedback).   Resisted cable walk outs in all 4 directions x5 (80#)  Recip. stair climbing x4 without use of hands or handrails.  Focus on L quad eccentric control.  Supine hamstring/ hip and knee flexion stretches (5 min.).   Scifit L610 min.(warm-up/no charge)  Discussed HEP   PT Long Term Goals - 01/19/18 1455      PT LONG TERM GOAL #1   Title  Pt. will complete FOTO and improve to 56 to improve daily functional mobility.    Baseline  11/19/17 FOTO: 30.  11/13: 48.  12/16: 72.      Time  4    Period  Weeks    Status  Achieved    Target Date  01/19/18      PT LONG TERM GOAL #2   Title  Pt. will achieve 0 degrees of knee extension on L LE for functional mobility around home.     Baseline  11/19/17 PROM: -6 deg.  11/13: 0 deg. extension in supine position    Time  4    Period  Weeks    Status  Achieved    Target Date  12/17/17      PT LONG TERM GOAL #3   Title  Pt will increase strength of L LE by at least 1/2 MMT grade in order to  demonstrate improvement in strength and function     Baseline  R LE muscle strength 5/5 MMT.  L quad/hamstring 4/5 MMT and hip flexion 4-/5 MMT    Time  4    Period  Weeks    Status  Partially Met    Target Date  02/16/18      PT LONG TERM GOAL #4   Title  Pt. will decrease overall edema of L LE to be WNL when compared to R LE.    Baseline  Improvement noted in L quad muscle activation.      Time  4    Period  Weeks    Status  Achieved    Target Date  12/17/17      PT LONG TERM GOAL #5   Title  Pt. will increase L knee flexion to >110 deg. to improve sit to stands/ gait pattern progression outside of brace.      Baseline  L knee 116 deg. AROM/ 121 deg. PROM.     Time  4    Period  Weeks    Status  Achieved    Target Date  01/19/18  Additional Long Term Goals   Additional Long Term Goals  Yes      PT LONG TERM GOAL #6   Title  Pt. able to return to work with no L knee pain or limitations during a full work day.      Baseline  pt. has not yet been able to return to work.      Time  4    Period  Weeks    Status  New    Target Date  02/16/18        Pt. continues to impress with progression of B LE strengthening ex. program/ independence with more normalized gait pattern. No c/o pain t/o tx. session and pt. able to complete single leg squats with improved quad control on TG. Pt. will be discharged from PT after next tx. session and PT recommends return to work with no limitations. PT will check goals next tx. session.       Patient will benefit from skilled therapeutic intervention in order to improve the following deficits and impairments:  Abnormal gait, Decreased endurance, Hypomobility, Impaired sensation, Increased edema, Decreased strength, Decreased activity tolerance, Decreased mobility, Difficulty walking, Decreased range of motion, Impaired flexibility  Visit Diagnosis: Muscle weakness (generalized)  Other abnormalities of gait and mobility  Decreased range  of motion (ROM) of left knee     Problem List There are no active problems to display for this patient.  [name], PT, DPT # [phone] 02/15/2018, 9:17 PM   [facility] [address] [place], [place], [address] Phone: [phone]   Fax:  [phone]  Name: Patricia Rose MRN: [id number] Date of Birth: Aug 03, 1955

## 2018-02-16 ENCOUNTER — Ambulatory Visit: Payer: BLUE CROSS/BLUE SHIELD | Admitting: Physical Therapy

## 2018-02-16 ENCOUNTER — Encounter: Payer: Self-pay | Admitting: Physical Therapy

## 2018-02-16 DIAGNOSIS — M6281 Muscle weakness (generalized): Secondary | ICD-10-CM

## 2018-02-16 DIAGNOSIS — M25662 Stiffness of left knee, not elsewhere classified: Secondary | ICD-10-CM

## 2018-02-16 DIAGNOSIS — R2689 Other abnormalities of gait and mobility: Secondary | ICD-10-CM

## 2018-02-16 NOTE — Patient Instructions (Signed)
Access Code: [email or web address]  URL: [email or web address]  Date: 02/16/2018  Prepared by: Myles Gip   Exercises  Sitting Knee Extension with Resistance - 10 reps - 3 sets - 1x daily - 4x weekly  Seated Hip Abduction - 10 reps - 3 sets - 1x daily - 4x weekly

## 2018-02-17 NOTE — Therapy (Signed)
[facility] [address] [place], [place], [address] Phone: [phone]   Fax:  [phone]  Physical Therapy Treatment/Discharge  Patient Details  Name: Patricia Rose MRN: [id number] Date of Birth: 1955-07-28 Referring Provider (PT): Allean Found, MD   Encounter Date: 02/16/2018  PT End of Session - 02/16/18 0942    Visit Number  24    Number of Visits  25    Date for PT Re-Evaluation  02/16/18    PT Start Time  0942    PT Stop Time  1024    PT Time Calculation (min)  42 min    Activity Tolerance  Patient tolerated treatment well    Behavior During Therapy  [facility] for tasks assessed/performed       History reviewed. No pertinent past medical history.  History reviewed. No pertinent surgical history.  There were no vitals filed for this visit.  Subjective Assessment - 02/16/18 0941    Subjective  Patient is excited to get back to normal. She reports [facility] to [facility] the community center with no pain.    Limitations  Lifting;Standing;Walking;House hold activities    Patient Stated Goals  Increase L knee ROM/ strength    Currently in Pain?  No/denies       TREATMENT  Therapeutic Exercise: TM 1.8-2.0 mph at 3% incline for 5 min. (gait assessed for stride length/ step pattern/ symmetrical loading of LEs) BOSU LLE lungesx10in //-bars with occasional UE assist needed to prevent LOB BOSU LLE step up with 3 sec balance at top x10 (mirror feedback).   Recip.stair climbingx10 without use ofhands or handrails.Focus on L quad eccentric control.  Seated GTB LAQ and clamshells x 20 with VCs for set-up and form as part of home maintenance program.  Goals re-assessed, see below.  Post-session AROM (measured in sitting):  LLE knee extension: 0 degrees LLE knee flexion: 128 degrees    PT Education - 02/16/18 1239    Education Details  Patient provided with HEP to ensure maintenance of strength and function after  discharge. Patient educated to contact the clinic if there are any changes, questions, or concerns regarding this episode of care.     Person(s) Educated  Patient    Methods  Explanation;Demonstration;Handout    Comprehension  Verbalized understanding;Returned demonstration          PT Long Term Goals - 02/16/18 0946      PT LONG TERM GOAL #1   Title  Pt. will complete FOTO and improve to 56 to improve daily functional mobility.    Baseline  11/19/17 FOTO: 30.  11/13: 48.  12/16: 72.  02/16/2018: 72    Time  4    Period  Weeks    Status  Achieved      PT LONG TERM GOAL #2   Title  Pt. will achieve 0 degrees of knee extension on L LE for functional mobility around home.     Baseline  11/19/17 PROM: -6 deg.  11/13: 0 deg. extension in supine position    Time  4    Period  Weeks    Status  Achieved      PT LONG TERM GOAL #3   Title  Pt will increase strength of L LE by at least 1/2 MMT grade in order to demonstrate improvement in strength and function     Baseline  R LE muscle strength 5/5 MMT.  L quad/hamstring 4/5 MMT and hip flexion 4-/5 MMT;  02/16/2018: L quad/hamsting 5/5 and hip flexion 5/5    Time  4    Period  Weeks    Status  Achieved    Target Date  02/16/18      PT LONG TERM GOAL #4   Title  Pt. will decrease overall edema of L LE to be WNL when compared to R LE.    Baseline  Improvement noted in L quad muscle activation.      Time  4    Period  Weeks    Status  Achieved      PT LONG TERM GOAL #5   Title  Pt. will increase L knee flexion to >110 deg. to improve sit to stands/ gait pattern progression outside of brace.      Baseline  L knee 116 deg. AROM/ 121 deg. PROM. ; 128 AROM knee flexion    Time  4    Period  Weeks    Status  Achieved      PT LONG TERM GOAL #6   Title  Pt. able to return to work with no L knee pain or limitations during a full work day.      Baseline  pt. has not yet been able to return to work.  02/16/2018: Patient feels able to go back to  work and meet the demands in that setting    Time  4    Period  Weeks    Status  Achieved    Target Date  02/16/18            Plan - 02/17/18 1103    Clinical Impression Statement  Patient presents to clinic with excellent motivation and confidence in her ability to self-manage and return to her PLOF. Patient has maintained her gains in strength, ROM, gait mechanics, and stair negotiation, with no notable LLE deficits in comparison with RLE. Patient was provided with a home maintenance program for LE strength and ROM and is appropriate for discharge at this time having met all goals for physical therapy.    Rehab Potential  Good    PT Frequency  2x / week    PT Duration  4 weeks    PT Treatment/Interventions  Cryotherapy;Electrical Stimulation;Iontophoresis 67m/ml Dexamethasone;Moist Heat;Traction;Ultrasound;Gait training;Stair training;Functional mobility training;Therapeutic activities;Therapeutic exercise;Manual techniques;Patient/family education;Neuromuscular re-education;Balance training;Scar mobilization;Energy conservation;Dry needling;Passive range of motion    PT Next Visit Plan  Progress treatment per MD protocol.  1 more visit/ Check goals and send MD progress note.      PT Home Exercise Plan  see notes.       Patient will benefit from skilled therapeutic intervention in order to improve the following deficits and impairments:  Abnormal gait, Decreased endurance, Hypomobility, Impaired sensation, Increased edema, Decreased strength, Decreased activity tolerance, Decreased mobility, Difficulty walking, Decreased range of motion, Impaired flexibility  Visit Diagnosis: Muscle weakness (generalized)  Other abnormalities of gait and mobility  Decreased range of motion (ROM) of left knee     Problem List There are no active problems to display for this patient.  KMyles GipPT, DPT #[id number]/14/2020, 8:45 AM  [place] [facility]17[address] StreetMPort [place] [address]Phone: [phone]  Fax:  [phone] Name: Patricia BASICHMRN: [id number] of Birth: [birth year]

## 2018-10-19 ENCOUNTER — Other Ambulatory Visit: Payer: Self-pay | Admitting: Family Medicine

## 2018-10-19 DIAGNOSIS — Z1231 Encounter for screening mammogram for malignant neoplasm of breast: Secondary | ICD-10-CM

## 2018-11-10 ENCOUNTER — Ambulatory Visit
Admission: RE | Admit: 2018-11-10 | Discharge: 2018-11-10 | Disposition: A | Discharge status: Home / Self Care | Payer: BC Managed Care – PPO | Source: Ambulatory Visit | Attending: Family Medicine | Admitting: Family Medicine

## 2018-11-10 ENCOUNTER — Other Ambulatory Visit: Payer: Self-pay

## 2018-11-10 DIAGNOSIS — Z1231 Encounter for screening mammogram for malignant neoplasm of breast: Secondary | ICD-10-CM | POA: Diagnosis present

## 2019-03-12 ENCOUNTER — Encounter: Payer: Self-pay | Admitting: Emergency Medicine

## 2019-03-12 ENCOUNTER — Ambulatory Visit
Admission: EM | Admit: 2019-03-12 | Discharge: 2019-03-12 | Disposition: A | Discharge status: Home / Self Care | Payer: BC Managed Care – PPO

## 2019-03-12 ENCOUNTER — Other Ambulatory Visit: Payer: Self-pay

## 2019-03-12 DIAGNOSIS — G44209 Tension-type headache, unspecified, not intractable: Secondary | ICD-10-CM

## 2019-03-12 DIAGNOSIS — I10 Essential (primary) hypertension: Secondary | ICD-10-CM

## 2019-03-12 HISTORY — DX: Essential (primary) hypertension: I10

## 2019-03-12 HISTORY — DX: Hypothyroidism, unspecified: E03.9

## 2019-03-12 HISTORY — DX: Personal history of other diseases of the nervous system and sense organs: Z86.69

## 2019-03-12 HISTORY — DX: Vitamin D deficiency, unspecified: E55.9

## 2019-03-12 HISTORY — DX: Hyperlipidemia, unspecified: E78.5

## 2019-03-12 HISTORY — DX: Gastro-esophageal reflux disease without esophagitis: K21.9

## 2019-03-12 MED ORDER — BUTALBITAL-APAP-CAFFEINE 50-325-40 MG PO TABS: 1.0000 | ORAL_TABLET | Freq: Three times a day (TID) | ORAL | 0 refills | Status: DC | PRN

## 2019-03-12 NOTE — ED Triage Notes (Signed)
Patient c/o headache for 2 days.  Patient denies cold symptoms.  Patient denies fevers.

## 2019-03-12 NOTE — Discharge Instructions (Addendum)
It was very nice seeing you today in clinic. Thank you for entrusting me with your care.   Monitor blood pressures and keep a log to discuss with your provider. Reduce the amount of salt that you are using. I have given you some information.   Take Tylenol and/or Ibuprofen as needed for headache. May add the new medication, however be careful as it can make you sleepy. No driving. Make sure you are drinking lots of water.   Make arrangements to follow up with your regular doctor in 1 week for re-evaluation if not improving. If your symptoms/condition worsens, please seek follow up care either here or in the ER. Please remember, our [place] providers are "right here with you" when you need [place].   Again, it was my pleasure to take care of you today. Thank you for choosing our clinic. I hope that you start to feel better quickly.   Honor Loh, MSN, APRN, FNP-C, CEN Advanced Practice Provider [facility]

## 2019-03-13 ENCOUNTER — Encounter: Payer: Self-pay | Admitting: Urgent Care

## 2019-03-13 NOTE — ED Provider Notes (Signed)
[place], [place]   Name: Patricia Rose DOB: 06/20/1955 MRN: [id number] CSN: [id number] PCP: Sofie Hartigan, MD  Arrival date and time:  03/12/19 1521  Chief Complaint:  Headache   NOTE: Prior to seeing the patient today, I have reviewed the triage nursing documentation and vital signs. Clinical staff has updated patient's PMH/PSHx, current medication list, and drug allergies/intolerances to ensure comprehensive history available to assist in medical decision making.   History:   HPI: Patricia Rose is a 64 y.o. female who presents today with complaints of a headache that began with acute onset 2 days ago. Patient denies any head trauma. Pain mainly overlying temples BILATERALLY. Patient has a remote PMH significant for migraines, however it has been awhile since she had one, therefore she is unsure if this headache is similar to her previous migraines. She denies preceding aura or known triggers. She has not experienced any associated nausea, vomiting, or photophobia. No recent illnesses or upper respiratory symptoms. Patient denies focal weakness, nuchal rigidity, changes to her vision, AMS, ataxia, or difficulties with her speech. Patient concerned that her blood pressure may be causing her headache, as her blood pressures have been running in the 150/90s range for a few days. She does have a PMH (+) for HTN for which she takes a beta-blocker (metoprolol) and a CCB (amlodipine). She reports that this is compliant with her prescribed anti-hypertensive regimen, which maintain her blood pressures in the 130/80s range. Patient endorses the fact that she has been eating more dietary sodium as of late. Patient denies any associated chest pain, shortness of breath, or palpitations. In efforts to conservatively manage her symptoms at home, the patient notes that she has used APAP and IBU, which provided short term improvement of her headache.  Past Medical History:  Diagnosis Date   Colon cancer  ([place]) 2008   GERD (gastroesophageal reflux disease)    HLD (hyperlipidemia)    Hx of migraines    Hypertension    Hypothyroidism    Vitamin D deficiency     Past Surgical History:  Procedure Laterality Date   ABDOMINAL HYSTERECTOMY      Family History  Problem Relation Age of Onset   Breast cancer Sister 26   Heart disease Mother    Asthma Father    Heart disease Father     Social History   Tobacco Use   Smoking status: Former Smoker   Smokeless tobacco: Never Used  Substance Use Topics   Alcohol use: Never   Drug use: Never    There are no problems to display for this patient.   Home Medications:    Current Meds  Medication Sig   amLODipine (NORVASC) 5 MG tablet    Cholecalciferol 50 MCG (2000 UT) CAPS Take by mouth.   gabapentin (NEURONTIN) 100 MG capsule Take by mouth.   levothyroxine (SYNTHROID) 50 MCG tablet TAKE 1 TABLET ON AN EMPTY STOMACH WITH A GLASS OF WATER AT LEAST 30 TO 60 MINUTES BEFORE BREAKFAST.   metoprolol succinate (TOPROL-XL) 50 MG 24 hr tablet TAKE ONE (1) TABLET BY MOUTH ONCE DAILY   omeprazole (PRILOSEC) 40 MG capsule TAKE (1) CAPSULE BY MOUTH EVERY DAY   simvastatin (ZOCOR) 20 MG tablet TAKE ONE TABLET AT BEDTIME.    Allergies:   Patient has no known allergies.  Review of Systems (ROS): Review of Systems  Constitutional: Negative for chills and fever.  HENT: Negative for congestion, facial swelling, postnasal drip, rhinorrhea, sinus pressure, sinus pain, sneezing  and sore throat.   Eyes: Negative for photophobia, pain and visual disturbance.  Respiratory: Negative for cough and shortness of breath.   Cardiovascular: Negative for chest pain and palpitations.       Elevated BP; Hx HTN  Gastrointestinal: Negative for abdominal pain, nausea and vomiting.  Neurological: Positive for headaches. Negative for dizziness, syncope, speech difficulty, weakness and numbness.  All other systems reviewed and are negative.      Vital Signs: Today's Vitals   03/12/19 1545 03/12/19 1546 03/12/19 1548 03/12/19 1623  BP:   (!) 155/98   Pulse:   78   Resp:   16   Temp:   98.5 F (36.9 C)   TempSrc:   Oral   SpO2:   98%   Weight:  150 lb (68 kg)    Height:  5\' 5"  (1.651 m)    PainSc: 9    9     Physical Exam: Physical Exam  Constitutional: She is oriented to person, place, and time and well-developed, well-nourished, and in no distress.  HENT:  Head: Normocephalic and atraumatic.  Right Ear: Tympanic membrane normal.  Left Ear: Tympanic membrane normal.  Nose: Nose normal.  Mouth/Throat: Uvula is midline, oropharynx is clear and moist and mucous membranes are normal.  Eyes: Pupils are equal, round, and reactive to light. EOM are normal.  Neck: Carotid bruit is not present. No tracheal deviation present.  Cardiovascular: Normal rate, regular rhythm, normal heart sounds and intact distal pulses.  Pulmonary/Chest: Effort normal and breath sounds normal.  Abdominal: Soft. Normal appearance, normal aorta and bowel sounds are normal. She exhibits no distension. There is no abdominal tenderness.  Musculoskeletal:        General: No edema. Normal range of motion.     Cervical back: Normal range of motion and neck supple.  Lymphadenopathy:    She has no cervical adenopathy.  Neurological: She is alert and oriented to person, place, and time. She has normal sensation, normal strength, normal reflexes and intact cranial nerves. Gait normal.  Skin: Skin is warm and dry. No rash noted. She is not diaphoretic.  Psychiatric: Mood, memory, affect and judgment normal.  Nursing note and vitals reviewed.   Urgent Care Treatments / Results:   No orders of the defined types were placed in this encounter.   LABS: PLEASE NOTE: all labs that were ordered this encounter are listed, however only abnormal results are displayed. Labs Reviewed - No data to display  EKG: -None  RADIOLOGY: No results  found.  PROCEDURES: Procedures  MEDICATIONS RECEIVED THIS VISIT: Medications - No data to display  PERTINENT CLINICAL COURSE NOTES/UPDATES:   Initial Impression / Assessment and Plan / Urgent Care Course:  Pertinent labs & imaging results that were available during my care of the patient were personally reviewed by me and considered in my medical decision making (see lab/imaging section of note for values and interpretations).  SHANYIA BALLIN is a 64 y.o. female who presents to [facility] today with complaints of Headache  Patient is well appearing overall in clinic today. She does not appear to be in any acute distress. Presenting symptoms (see HPI) and exam as documented above. Exam reassuring providing no acute findings. Patient is grossly NI with no focal neurological concerns identified. Symptom constellation favors tension-type headache over migraine. Etiology likely related to elevated blood pressure secondary to increased intake of dietary sodium. This is an acute elevation for her, as she notes that her blood pressures  are normally well controlled. We discussed potentially increasing her CCB dose, however this could cause her feet/ankles to swell. Patient would like to remain on the lowest dose possible and favors a more conservative approach. Patient educated on lifestyle/dietary modifications to help reduce her blood pressure. Patient advised of need to increase fluid intake (water) and to decrease her intake of dietary sodium. She was provided with written information on today's AVS for her review. Encouraged patient to monitor blood pressures daily. Discussed assessing pressures at the same time and in the same arm. She was advised to log readings for review by her PCP. If pressures remain elevated then we/PCP will have to consider up titration of her CCB dose; verbalizes understanding. In the meantime, patient needs relief from her headaches. APAP and IBU only provide temporary  relief. Will provide a short term supply of Fioricet 50/325/40 mg tablets for PRN use; indications and associated risks reviewed.   Discussed follow up with primary care physician in 1 week, or sooner if needed, for re-evaluation if blood pressure and headaches are not improving. I have reviewed the follow up and strict return precautions for any new or worsening symptoms. Patient is aware of symptoms that would be deemed urgent/emergent, and would thus require further evaluation either here or in the emergency department. At the time of discharge, she verbalized understanding and consent with the discharge plan as it was reviewed with her. All questions were fielded by provider and/or clinic staff prior to patient discharge.    Final Clinical Impressions / Urgent Care Diagnoses:   Final diagnoses:  Acute non intractable tension-type headache  Essential hypertension    New Prescriptions:  [place] Controlled Substance Registry consulted? Yes, I have consulted the [place] Controlled Substances Registry for this patient, and feel the risk/benefit ratio today is favorable for proceeding with this prescription for a controlled substance.   Discussed use of controlled substance medication to treat her acute pain.  o Reviewed [place] STOP Act regulations  o Clinic does not refill controlled substances over the phone without face to face evaluation.   Safety precautions reviewed.  o Medications should not be bitten, chewed, sold, or taken with alcohol.  o Avoid use while working, driving, or operating heavy machinery.  o Side effects associated with the use of this particular medication reviewed. - Patient understands that this medication can cause CNS depression, increase her risk of falls, and even lead to overdose that may result in death, if used outside of the parameters that she and I discussed.  With all of this in mind, she knowingly accepts the risks and responsibilities associated with intended course of  treatment, and elects to responsibly proceed as discussed.  Meds ordered this encounter  Medications   butalbital-acetaminophen-caffeine (FIORICET) 50-325-40 MG tablet    Sig: Take 1 tablet by mouth 3 (three) times daily as needed for headache.    Dispense:  12 tablet    Refill:  0    Recommended Follow up Care:  Patient encouraged to follow up with the following provider within the specified time frame, or sooner as dictated by the severity of her symptoms. As always, she was instructed that for any urgent/emergent care needs, she should seek care either here or in the emergency department for more immediate evaluation.  Follow-up Information    Feldpausch, Chrissie Noa, MD In 1 week.   Specialty: Family Medicine Why: General reassessment of symptoms if not improving Contact information: [place] [address] [phone]  NOTE: This note was prepared using Lobbyist along with smaller Company secretary. Despite my best ability to proofread, there is the potential that transcriptional errors may still occur from this process, and are completely unintentional.    Karen Kitchens, NP 03/13/19 2012

## 2019-11-01 ENCOUNTER — Other Ambulatory Visit: Payer: Self-pay | Admitting: Family Medicine

## 2019-11-01 DIAGNOSIS — Z1231 Encounter for screening mammogram for malignant neoplasm of breast: Secondary | ICD-10-CM

## 2019-11-16 ENCOUNTER — Other Ambulatory Visit: Payer: Self-pay

## 2019-11-16 ENCOUNTER — Ambulatory Visit
Admission: RE | Admit: 2019-11-16 | Discharge: 2019-11-16 | Disposition: A | Discharge status: Home / Self Care | Payer: BC Managed Care – PPO | Source: Ambulatory Visit | Attending: Family Medicine | Admitting: Family Medicine

## 2019-11-16 DIAGNOSIS — Z1231 Encounter for screening mammogram for malignant neoplasm of breast: Secondary | ICD-10-CM | POA: Diagnosis not present

## 2020-10-19 ENCOUNTER — Other Ambulatory Visit: Payer: Self-pay | Admitting: Family Medicine

## 2020-10-19 DIAGNOSIS — Z1231 Encounter for screening mammogram for malignant neoplasm of breast: Secondary | ICD-10-CM

## 2020-11-16 ENCOUNTER — Ambulatory Visit
Admission: RE | Admit: 2020-11-16 | Discharge: 2020-11-16 | Disposition: A | Discharge status: Home / Self Care | Payer: BC Managed Care – PPO | Source: Ambulatory Visit | Attending: Family Medicine | Admitting: Family Medicine

## 2020-11-16 ENCOUNTER — Other Ambulatory Visit: Payer: Self-pay

## 2020-11-16 DIAGNOSIS — Z1231 Encounter for screening mammogram for malignant neoplasm of breast: Secondary | ICD-10-CM | POA: Insufficient documentation

## 2021-02-04 DIAGNOSIS — U071 COVID-19: Secondary | ICD-10-CM

## 2021-02-04 HISTORY — DX: COVID-19: U07.1

## 2021-02-11 ENCOUNTER — Encounter: Payer: Self-pay | Admitting: Emergency Medicine

## 2021-02-11 ENCOUNTER — Ambulatory Visit
Admission: EM | Admit: 2021-02-11 | Discharge: 2021-02-11 | Disposition: A | Discharge status: Home / Self Care | Payer: BC Managed Care – PPO | Attending: Internal Medicine | Admitting: Internal Medicine

## 2021-02-11 ENCOUNTER — Other Ambulatory Visit: Payer: Self-pay

## 2021-02-11 DIAGNOSIS — Z20822 Contact with and (suspected) exposure to covid-19: Secondary | ICD-10-CM

## 2021-02-11 MED ORDER — BENZONATATE 200 MG PO CAPS: 200.0000 mg | ORAL_CAPSULE | Freq: Three times a day (TID) | ORAL | 0 refills | Status: DC | PRN

## 2021-02-11 NOTE — ED Triage Notes (Signed)
Patient c/o runny nose and cough that started on Friday.  Patient denies fevers.  Patient took home COVID test yesterday and was positive.  Patient states that she would like a PCR covid test.

## 2021-02-11 NOTE — ED Provider Notes (Signed)
[facility]    CSN: [id number] Arrival date & time: 02/11/21  0957      History   Chief Complaint Chief Complaint  Patient presents with   Covid Positive   Cough   Nasal Congestion    HPI Patricia Rose is a 66 y.o. female who presents with onset of non productive cough and HA 3 days ago. She did a home covid test yesterday and is positive, but she wants a PCR test done. Denies fever or body aches. Was tired yesterday She has never had covid infection. Has had 2 Covid injections.    Past Medical History:  Diagnosis Date   Colon cancer ([place]) 2008   GERD (gastroesophageal reflux disease)    HLD (hyperlipidemia)    Hx of migraines    Hypertension    Hypothyroidism    Vitamin D deficiency     There are no problems to display for this patient.   Past Surgical History:  Procedure Laterality Date   ABDOMINAL HYSTERECTOMY      OB History   No obstetric history on file.      Home Medications    Prior to Admission medications   Medication Sig Start Date End Date Taking? Authorizing Provider  amLODipine (NORVASC) 5 MG tablet  02/19/19  Yes [provider]  benzonatate (TESSALON) 200 MG capsule Take 1 capsule (200 mg total) by mouth 3 (three) times daily as needed for cough. 02/11/21  Yes Rodriguez-Southworth, Sunday Spillers, PA-C  levothyroxine (SYNTHROID) 50 MCG tablet TAKE 1 TABLET ON AN EMPTY STOMACH WITH A GLASS OF WATER AT LEAST 30 TO 60 MINUTES BEFORE BREAKFAST. 01/18/19  Yes [provider]  metoprolol succinate (TOPROL-XL) 50 MG 24 hr tablet TAKE ONE (1) TABLET BY MOUTH ONCE DAILY 10/22/17  Yes [provider]  omeprazole (PRILOSEC) 40 MG capsule TAKE (1) CAPSULE BY MOUTH EVERY DAY 06/06/17  Yes [provider]  simvastatin (ZOCOR) 20 MG tablet TAKE ONE TABLET AT BEDTIME. 10/22/17  Yes [provider]  butalbital-acetaminophen-caffeine (FIORICET) 50-325-40 MG tablet Take 1 tablet by mouth 3 (three) times daily as needed  for headache. 03/12/19   Karen Kitchens, NP  Cholecalciferol 50 MCG (2000 UT) CAPS Take by mouth.    [provider]  gabapentin (NEURONTIN) 100 MG capsule Take by mouth. 11/11/17   [provider]    Family History Family History  Problem Relation Age of Onset   Breast cancer Sister 36   Heart disease Mother    Asthma Father    Heart disease Father     Social History Social History   Tobacco Use   Smoking status: Former   Smokeless tobacco: Never  Scientific laboratory technician Use: Never used  Substance Use Topics   Alcohol use: Never   Drug use: Never     Allergies   Patient has no known allergies.   Review of Systems Review of Systems  Constitutional:  Positive for fatigue. Negative for fever.  Respiratory:  Positive for cough.   Neurological:  Positive for headaches.  The rest is neg  Physical Exam Triage Vital Signs ED Triage Vitals  Enc Vitals Group     BP 02/11/21 1026 (!) 162/98     Pulse Rate 02/11/21 1026 (!) 110     Resp 02/11/21 1026 14     Temp 02/11/21 1026 98.4 F (36.9 C)     Temp Source 02/11/21 1026 Oral     SpO2 02/11/21 1026 97 %  Weight 02/11/21 1024 150 lb (68 kg)     Height 02/11/21 1024 5\' 5"  (1.651 m)     Head Circumference --      Peak Flow --      Pain Score 02/11/21 1024 0     Pain Loc --      Pain Edu? --      Excl. in [place]? --    No data found.  Updated Vital Signs BP (!) 162/98 (BP Location: Left Arm)    Pulse (!) 110    Temp 98.4 F (36.9 C) (Oral)    Resp 14    Ht 5\' 5"  (1.651 m)    Wt 150 lb (68 kg)    SpO2 97%    BMI 24.96 kg/m   Visual Acuity Right Eye Distance:   Left Eye Distance:   Bilateral Distance:    Right Eye Near:   Left Eye Near:    Bilateral Near:     Physical Exam Physical Exam Vitals signs and nursing note reviewed.  Constitutional:      General: She is not in acute distress.    Appearance: Normal appearance. She is not ill-appearing, toxic-appearing or diaphoretic.  HENT:     Head:  Normocephalic.     Right Ear: Tympanic membrane, ear canal and external ear normal.     Left Ear: Tympanic membrane, ear canal and external ear normal.     Nose: Nose normal.     Mouth/Throat:     Mouth: Mucous membranes are moist.  Eyes:     General: No scleral icterus.       Right eye: No discharge.        Left eye: No discharge.     Conjunctiva/sclera: Conjunctivae normal.  Neck:     Musculoskeletal: Neck supple. No neck rigidity.  Cardiovascular:     Rate and Rhythm: Normal rate and regular rhythm.     Heart sounds: No murmur.  Pulmonary:     Effort: Pulmonary effort is normal.     Breath sounds: Normal breath sounds.   Musculoskeletal: Normal range of motion.  Lymphadenopathy:     Cervical: No cervical adenopathy.  Skin:    General: Skin is warm and dry.     Coloration: Skin is not jaundiced.     Findings: No rash.  Neurological:     Mental Status: She is alert and oriented to person, place, and time.     Gait: Gait normal.  Psychiatric:        Mood and Affect: Mood normal.        Behavior: Behavior normal.        Thought Content: Thought content normal.        Judgment: Judgment normal.    UC Treatments / Results  Labs (all labs ordered are listed, but only abnormal results are displayed) Labs Reviewed  SARS CORONAVIRUS 2 (TAT 6-24 HRS)    EKG   Radiology No results found.  Procedures Procedures (including critical care time)  Medications Ordered in UC Medications - No data to display  Initial Impression / Assessment and Plan / UC Course  I have reviewed the triage vital signs and the nursing notes. Covid suspect Covid test pending and we will call her with results if positive. She does not want the Paxolovid, but is OK with Molnupiravir.  I placed her on Tessalon as noted for her cough. See instructions.  Final Clinical Impressions(s) / UC Diagnoses   Final diagnoses:  Contact with  and (suspected) exposure to covid-19     Discharge  Instructions      We will call you if the test is positive If positive you need to quarantine for 5 days from onset of symptoms, then wear a mask in public for 5 days   If you get short of breath and you are able to check  your oxygen with a pulse oxygen meter, if it gets to 92% or less, you need to go to the hospital to be admitted. If you dont have one, come back here and we will assess you.  Avoid laying down all the time so you don't end up with pneumonia. Try to walk around 10 minutes a day minimum     ED Prescriptions     Medication Sig Dispense Auth. Provider   benzonatate (TESSALON) 200 MG capsule Take 1 capsule (200 mg total) by mouth 3 (three) times daily as needed for cough. 30 capsule Rodriguez-Southworth, Sunday Spillers, PA-C      PDMP not reviewed this encounter.   Shelby Mattocks, PA-C 02/11/21 1113

## 2021-02-11 NOTE — Discharge Instructions (Addendum)
We will call you if the test is positive If positive you need to quarantine for 5 days from onset of symptoms, then wear a mask in public for 5 days   If you get short of breath and you are able to check  your oxygen with a pulse oxygen meter, if it gets to 92% or less, you need to go to the hospital to be admitted. If you dont have one, come back here and we will assess you.  Avoid laying down all the time so you don't end up with pneumonia. Try to walk around 10 minutes a day minimum

## 2021-02-12 LAB — SARS CORONAVIRUS 2 (TAT 6-24 HRS): SARS Coronavirus 2: POSITIVE — AB

## 2021-10-03 ENCOUNTER — Other Ambulatory Visit: Payer: Self-pay | Admitting: Family Medicine

## 2021-10-03 DIAGNOSIS — Z1231 Encounter for screening mammogram for malignant neoplasm of breast: Secondary | ICD-10-CM

## 2021-11-28 ENCOUNTER — Ambulatory Visit
Admission: RE | Admit: 2021-11-28 | Discharge: 2021-11-28 | Disposition: A | Discharge status: Home / Self Care | Payer: BC Managed Care – PPO | Source: Ambulatory Visit | Attending: Family Medicine | Admitting: Family Medicine

## 2021-11-28 DIAGNOSIS — Z1231 Encounter for screening mammogram for malignant neoplasm of breast: Secondary | ICD-10-CM | POA: Diagnosis present

## 2022-05-07 ENCOUNTER — Ambulatory Visit: Payer: BC Managed Care – PPO

## 2022-05-07 DIAGNOSIS — Z1211 Encounter for screening for malignant neoplasm of colon: Secondary | ICD-10-CM | POA: Diagnosis present

## 2022-05-07 DIAGNOSIS — K640 First degree hemorrhoids: Secondary | ICD-10-CM | POA: Diagnosis not present

## 2022-05-07 DIAGNOSIS — D125 Benign neoplasm of sigmoid colon: Secondary | ICD-10-CM | POA: Diagnosis not present

## 2022-05-07 DIAGNOSIS — Z86010 Personal history of colonic polyps: Secondary | ICD-10-CM | POA: Diagnosis not present

## 2022-05-22 ENCOUNTER — Encounter: Payer: Self-pay | Admitting: Ophthalmology

## 2022-05-23 NOTE — Anesthesia Preprocedure Evaluation (Addendum)
Anesthesia Evaluation  Patient identified by MRN, date of birth, ID band Patient awake    Reviewed: Allergy & Precautions, H&P , NPO status , Patient's Chart, lab work & pertinent test results  Airway Mallampati: III  TM Distance: <3 FB Neck ROM: Full    Dental   Extremely poor dentition, patient denies any loose teeth:   Pulmonary former smoker Hx covid    Pulmonary exam normal breath sounds clear to auscultation       Cardiovascular hypertension, Normal cardiovascular exam Rhythm:Regular Rate:Normal     Neuro/Psych negative neurological ROS  negative psych ROS   GI/Hepatic Neg liver ROS,GERD  ,,Hx colon polyps    Endo/Other  Hypothyroidism    Renal/GU negative Renal ROS  negative genitourinary   Musculoskeletal negative musculoskeletal ROS (+)    Abdominal   Peds negative pediatric ROS (+)  Hematology negative hematology ROS (+)   Anesthesia Other Findings Hypertension  GERD (gastroesophageal reflux disease) HLD (hyperlipidemia)  Hx of migraines Vitamin D deficiency  Hypothyroidism Colon cancer  COVID-19    Reproductive/Obstetrics negative OB ROS                             Anesthesia Physical Anesthesia Plan  ASA: 2  Anesthesia Plan: MAC   Post-op Pain Management:    Induction: Intravenous  PONV Risk Score and Plan:   Airway Management Planned: Natural Airway and Nasal Cannula  Additional Equipment:   Intra-op Plan:   Post-operative Plan:   Informed Consent: I have reviewed the patients History and Physical, chart, labs and discussed the procedure including the risks, benefits and alternatives for the proposed anesthesia with the patient or authorized representative who has indicated his/her understanding and acceptance.     Dental Advisory Given  Plan Discussed with: Anesthesiologist, CRNA and Surgeon  Anesthesia Plan Comments: (Patient consented for  risks of anesthesia including but not limited to:  - adverse reactions to medications - damage to eyes, teeth, lips or other oral mucosa - nerve damage due to positioning  - sore throat or hoarseness - Damage to heart, brain, nerves, lungs, other parts of body or loss of life  Patient voiced understanding.)       Anesthesia Quick Evaluation

## 2022-05-27 NOTE — Discharge Instructions (Signed)

## 2022-05-28 ENCOUNTER — Encounter: Payer: Self-pay | Admitting: Ophthalmology

## 2022-05-28 ENCOUNTER — Ambulatory Visit
Admission: RE | Admit: 2022-05-28 | Discharge: 2022-05-28 | Disposition: A | Discharge status: Home / Self Care | Payer: BC Managed Care – PPO | Attending: Ophthalmology | Admitting: Ophthalmology

## 2022-05-28 ENCOUNTER — Encounter
Admission: RE | Disposition: A | Discharge status: Home / Self Care | Payer: Self-pay | Source: Home / Self Care | Attending: Ophthalmology

## 2022-05-28 ENCOUNTER — Ambulatory Visit: Payer: BC Managed Care – PPO | Admitting: Anesthesiology

## 2022-05-28 ENCOUNTER — Other Ambulatory Visit: Payer: Self-pay

## 2022-05-28 DIAGNOSIS — Z85038 Personal history of other malignant neoplasm of large intestine: Secondary | ICD-10-CM | POA: Diagnosis not present

## 2022-05-28 DIAGNOSIS — E785 Hyperlipidemia, unspecified: Secondary | ICD-10-CM | POA: Insufficient documentation

## 2022-05-28 DIAGNOSIS — Z09 Encounter for follow-up examination after completed treatment for conditions other than malignant neoplasm: Secondary | ICD-10-CM | POA: Diagnosis not present

## 2022-05-28 DIAGNOSIS — Z8616 Personal history of COVID-19: Secondary | ICD-10-CM | POA: Diagnosis not present

## 2022-05-28 DIAGNOSIS — E039 Hypothyroidism, unspecified: Secondary | ICD-10-CM | POA: Diagnosis not present

## 2022-05-28 DIAGNOSIS — Z87891 Personal history of nicotine dependence: Secondary | ICD-10-CM | POA: Diagnosis not present

## 2022-05-28 DIAGNOSIS — I10 Essential (primary) hypertension: Secondary | ICD-10-CM | POA: Diagnosis not present

## 2022-05-28 DIAGNOSIS — H2512 Age-related nuclear cataract, left eye: Secondary | ICD-10-CM | POA: Diagnosis present

## 2022-05-28 DIAGNOSIS — K219 Gastro-esophageal reflux disease without esophagitis: Secondary | ICD-10-CM | POA: Insufficient documentation

## 2022-05-28 DIAGNOSIS — Z08 Encounter for follow-up examination after completed treatment for malignant neoplasm: Secondary | ICD-10-CM | POA: Insufficient documentation

## 2022-05-28 HISTORY — PX: CATARACT EXTRACTION W/PHACO: SHX586

## 2022-05-28 SURGERY — PHACOEMULSIFICATION, CATARACT, WITH IOL INSERTION
Anesthesia: Monitor Anesthesia Care | Site: Eye | Laterality: Left

## 2022-05-28 MED ORDER — SIGHTPATH DOSE#1 NA CHONDROIT SULF-NA HYALURON 40-17 MG/ML IO SOLN
INTRAOCULAR | Status: DC | PRN
  Administered 2022-05-28: 1 mL via INTRAOCULAR

## 2022-05-28 MED ORDER — MOXIFLOXACIN HCL 0.5 % OP SOLN
OPHTHALMIC | Status: DC | PRN
  Administered 2022-05-28: .2 mL via OPHTHALMIC

## 2022-05-28 MED ORDER — SIGHTPATH DOSE#1 BSS IO SOLN
INTRAOCULAR | Status: DC | PRN
  Administered 2022-05-28 (×2): 15 mL

## 2022-05-28 MED ORDER — FENTANYL CITRATE (PF) 100 MCG/2ML IJ SOLN
INTRAMUSCULAR | Status: DC | PRN
  Administered 2022-05-28: 50 ug via INTRAVENOUS

## 2022-05-28 MED ORDER — ARMC OPHTHALMIC DILATING DROPS
1.0000 | OPHTHALMIC | Status: DC | PRN
  Administered 2022-05-28 (×3): 1 via OPHTHALMIC

## 2022-05-28 MED ORDER — SIGHTPATH DOSE#1 BSS IO SOLN
INTRAOCULAR | Status: DC | PRN
  Administered 2022-05-28: 1 mL via INTRAMUSCULAR

## 2022-05-28 MED ORDER — SIGHTPATH DOSE#1 BSS IO SOLN
INTRAOCULAR | Status: DC | PRN
  Administered 2022-05-28: 114 mL via OPHTHALMIC

## 2022-05-28 MED ORDER — TETRACAINE HCL 0.5 % OP SOLN
1.0000 [drp] | OPHTHALMIC | Status: DC | PRN
  Administered 2022-05-28 (×3): 1 [drp] via OPHTHALMIC

## 2022-05-28 MED ORDER — LACTATED RINGERS IV SOLN: INTRAVENOUS | Status: DC

## 2022-05-28 MED ORDER — MIDAZOLAM HCL 2 MG/2ML IJ SOLN
INTRAMUSCULAR | Status: DC | PRN
  Administered 2022-05-28: 2 mg via INTRAVENOUS

## 2022-05-28 MED ORDER — BRIMONIDINE TARTRATE-TIMOLOL 0.2-0.5 % OP SOLN
OPHTHALMIC | Status: DC | PRN
  Administered 2022-05-28: 1 [drp] via OPHTHALMIC

## 2022-05-28 SURGICAL SUPPLY — 10 items
CATARACT SUITE SIGHTPATH (MISCELLANEOUS) ×1 IMPLANT
FEE CATARACT SUITE SIGHTPATH (MISCELLANEOUS) ×1 IMPLANT
GLOVE BIOGEL PI IND STRL 8 (GLOVE) ×1 IMPLANT
GLOVE SURG ENC TEXT LTX SZ8 (GLOVE) ×1 IMPLANT
LENS IOL TECNIS EYHANCE 14.0 (Intraocular Lens) IMPLANT
NDL FILTER BLUNT 18X1 1/2 (NEEDLE) ×1 IMPLANT
NEEDLE FILTER BLUNT 18X1 1/2 (NEEDLE) ×1 IMPLANT
SUT ETHILON 10-0 CS-B-6CS-B-6 (SUTURE) ×1
SUTURE EHLN 10-0 CS-B-6CS-B-6 (SUTURE) IMPLANT
SYR 3ML LL SCALE MARK (SYRINGE) ×1 IMPLANT

## 2022-05-28 NOTE — Op Note (Signed)
PREOPERATIVE DIAGNOSIS:  Nuclear sclerotic cataract of the left eye.   POSTOPERATIVE DIAGNOSIS:  Nuclear sclerotic cataract of the left eye.   OPERATIVE PROCEDURE:ORPROCALL@   SURGEON:  Galen Manila, MD.   ANESTHESIA:  Anesthesiologist: Marisue Humble, MD  1.      Managed anesthesia care. 2.     0.16ml of Shugarcaine was instilled following the paracentesis   COMPLICATIONS:  single 10-0 suture placed to improved main incision closure.   TECHNIQUE:   Stop and chop   DESCRIPTION OF PROCEDURE:  The patient was examined and consented in the preoperative holding area where the aforementioned topical anesthesia was applied to the left eye and then brought back to the Operating Room where the left eye was prepped and draped in the usual sterile ophthalmic fashion and a lid speculum was placed. A paracentesis was created with the side port blade and the anterior chamber was filled with viscoelastic. A near clear corneal incision was performed with the steel keratome. A continuous curvilinear capsulorrhexis was performed with a cystotome followed by the capsulorrhexis forceps. Hydrodissection and hydrodelineation were carried out with BSS on a blunt cannula. The lens was removed in a stop and chop  technique and the remaining cortical material was removed with the irrigation-aspiration handpiece. The capsular bag was inflated with viscoelastic and the Technis [id number] lens was placed in the capsular bag without complication. The remaining viscoelastic was removed from the eye with the irrigation-aspiration handpiece. The wounds were hydrated. The anterior chamber was flushed with BSS and the eye was inflated to physiologic pressure. 0.62ml Vigamox was placed in the anterior chamber. The wounds were found to be water tight. The eye was dressed with Combigan. The patient was given protective glasses to wear throughout the day and a shield with which to sleep tonight. The patient was also given drops with  which to begin a drop regimen today and will follow-up with me in one day. Implant Name Type Inv. Item Serial No. Manufacturer Lot No. LRB No. Used Action  LENS IOL TECNIS EYHANCE 14.0 - [id number] Intraocular Lens LENS IOL TECNIS EYHANCE 14.0 [id number] [employer]  Left 1 Implanted    Procedure(s): CATARACT EXTRACTION PHACO AND INTRAOCULAR LENS PLACEMENT (IOC) LEFT  9.34  01:11.6 (Left)  Electronically signed: Galen Manila 05/28/2022 8:47 AM

## 2022-05-28 NOTE — Transfer of Care (Signed)
Immediate Anesthesia Transfer of Care Note  Patient: Patricia Rose  Procedure(s) Performed: CATARACT EXTRACTION PHACO AND INTRAOCULAR LENS PLACEMENT (IOC) LEFT  9.34  01:11.6 (Left: Eye)  Patient Location: PACU  Anesthesia Type: MAC  Level of Consciousness: awake, alert  and patient cooperative  Airway and Oxygen Therapy: Patient Spontanous Breathing and Patient connected to supplemental oxygen  Post-op Assessment: Post-op Vital signs reviewed, Patient's Cardiovascular Status Stable, Respiratory Function Stable, Patent Airway and No signs of Nausea or vomiting  Post-op Vital Signs: Reviewed and stable  Complications: No notable events documented.

## 2022-05-28 NOTE — Anesthesia Postprocedure Evaluation (Signed)
Anesthesia Post Note  Patient: Patricia Rose  Procedure(s) Performed: CATARACT EXTRACTION PHACO AND INTRAOCULAR LENS PLACEMENT (IOC) LEFT  9.34  01:11.6 (Left: Eye)  Patient location during evaluation: PACU Anesthesia Type: MAC Level of consciousness: awake and alert Pain management: pain level controlled Vital Signs Assessment: post-procedure vital signs reviewed and stable Respiratory status: spontaneous breathing, nonlabored ventilation, respiratory function stable and patient connected to nasal cannula oxygen Cardiovascular status: stable and blood pressure returned to baseline Postop Assessment: no apparent nausea or vomiting Anesthetic complications: no   No notable events documented.   Last Vitals:  Vitals:   05/28/22 0846 05/28/22 0851  BP: (!) 118/90 121/85  Pulse: 79 69  Resp: (!) 25 19  Temp: (!) 36.4 C (!) 36.4 C  SpO2: 99% 96%    Last Pain:  Vitals:   05/28/22 0851  PainSc: 0-No pain                 Jerrion Tabbert C Hortence Charter

## 2022-05-28 NOTE — H&P (Signed)
[facility]   Primary Care Physician:  Marina Goodell, MD Ophthalmologist: Dr. Druscilla Brownie  Pre-Procedure History & Physical: HPI:  Patricia Rose is a 67 y.o. female here for cataract surgery.   Past Medical History:  Diagnosis Date   Colon cancer 2008   COVID-19 2023   GERD (gastroesophageal reflux disease)    HLD (hyperlipidemia)    Hx of migraines    Hypertension    Hypothyroidism    Vitamin D deficiency     Past Surgical History:  Procedure Laterality Date   ABDOMINAL HYSTERECTOMY      Prior to Admission medications   Medication Sig Start Date End Date Taking? Authorizing Provider  amLODipine (NORVASC) 5 MG tablet Take 10 mg by mouth daily. 02/19/19  Yes [provider]  Cholecalciferol 50 MCG (2000 UT) CAPS Take by mouth.   Yes [provider]  gabapentin (NEURONTIN) 100 MG capsule Take by mouth. 11/11/17  Yes [provider]  levothyroxine (SYNTHROID) 50 MCG tablet TAKE 1 TABLET ON AN EMPTY STOMACH WITH A GLASS OF WATER AT LEAST 30 TO 60 MINUTES BEFORE BREAKFAST. 01/18/19  Yes [provider]  Magnesium 200 MG TABS Take by mouth daily.   Yes [provider]  metoprolol succinate (TOPROL-XL) 50 MG 24 hr tablet TAKE ONE (1) TABLET BY MOUTH ONCE DAILY 10/22/17  Yes [provider]  omeprazole (PRILOSEC) 40 MG capsule TAKE (1) CAPSULE BY MOUTH EVERY DAY 06/06/17  Yes [provider]  simvastatin (ZOCOR) 20 MG tablet TAKE ONE TABLET AT BEDTIME. 10/22/17  Yes [provider]    Allergies as of 05/08/2022   (No Known Allergies)    Family History  Problem Relation Age of Onset   Breast cancer Sister 41   Heart disease Mother    Asthma Father    Heart disease Father     Social History   Socioeconomic History   Marital status: Married    Spouse name: Not on file   Number of children: Not on file   Years of education: Not on file   Highest education level: Not on file  Occupational History    Not on file  Tobacco Use   Smoking status: Former    Types: Cigarettes    Quit date: 2020    Years since quitting: 4.3   Smokeless tobacco: Never  Vaping Use   Vaping Use: Never used  Substance and Sexual Activity   Alcohol use: Never   Drug use: Never   Sexual activity: Not on file  Other Topics Concern   Not on file  Social History Narrative   Not on file   Social Determinants of Health   Financial Resource Strain: Not on file  Food Insecurity: Not on file  Transportation Needs: Not on file  Physical Activity: Not on file  Stress: Not on file  Social Connections: Not on file  Intimate Partner Violence: Not on file    Review of Systems: See HPI, otherwise negative ROS  Physical Exam: BP (!) 147/100   Temp 97.8 F (36.6 C)   Ht  (1.651 m)   Wt 63 kg   SpO2 97%   BMI 23.13 kg/m  General:   Alert, cooperative in NAD Head:  Normocephalic and atraumatic. Respiratory:  Normal work of breathing. Cardiovascular:  RRR  Impression/Plan: Patricia Rose is here for cataract surgery.  Risks, benefits, limitations, and alternatives regarding cataract surgery have been reviewed with the patient.  Questions have been  answered.  All parties agreeable.   Galen Manila, MD  05/28/2022, 8:15 AM

## 2022-05-29 ENCOUNTER — Encounter: Payer: Self-pay | Admitting: Ophthalmology

## 2022-06-03 ENCOUNTER — Encounter: Payer: Self-pay | Admitting: Ophthalmology

## 2022-06-07 NOTE — Discharge Instructions (Signed)

## 2022-06-11 ENCOUNTER — Encounter: Payer: Self-pay | Admitting: Ophthalmology

## 2022-06-11 ENCOUNTER — Other Ambulatory Visit: Payer: Self-pay

## 2022-06-11 ENCOUNTER — Ambulatory Visit
Admission: RE | Admit: 2022-06-11 | Discharge: 2022-06-11 | Disposition: A | Discharge status: Home / Self Care | Payer: BC Managed Care – PPO | Attending: Ophthalmology | Admitting: Ophthalmology

## 2022-06-11 ENCOUNTER — Encounter
Admission: RE | Disposition: A | Discharge status: Home / Self Care | Payer: Self-pay | Source: Home / Self Care | Attending: Ophthalmology

## 2022-06-11 ENCOUNTER — Ambulatory Visit: Payer: BC Managed Care – PPO | Admitting: Anesthesiology

## 2022-06-11 DIAGNOSIS — H2511 Age-related nuclear cataract, right eye: Secondary | ICD-10-CM | POA: Diagnosis present

## 2022-06-11 DIAGNOSIS — I10 Essential (primary) hypertension: Secondary | ICD-10-CM | POA: Insufficient documentation

## 2022-06-11 DIAGNOSIS — Z8249 Family history of ischemic heart disease and other diseases of the circulatory system: Secondary | ICD-10-CM | POA: Insufficient documentation

## 2022-06-11 DIAGNOSIS — Z87891 Personal history of nicotine dependence: Secondary | ICD-10-CM | POA: Diagnosis not present

## 2022-06-11 HISTORY — PX: CATARACT EXTRACTION W/PHACO: SHX586

## 2022-06-11 SURGERY — PHACOEMULSIFICATION, CATARACT, WITH IOL INSERTION
Anesthesia: Monitor Anesthesia Care | Site: Eye | Laterality: Right

## 2022-06-11 MED ORDER — LACTATED RINGERS IV SOLN: INTRAVENOUS | Status: DC

## 2022-06-11 MED ORDER — MIDAZOLAM HCL 2 MG/2ML IJ SOLN
INTRAMUSCULAR | Status: DC | PRN
  Administered 2022-06-11: 2 mg via INTRAVENOUS

## 2022-06-11 MED ORDER — BRIMONIDINE TARTRATE-TIMOLOL 0.2-0.5 % OP SOLN
OPHTHALMIC | Status: DC | PRN
  Administered 2022-06-11: 1 [drp] via OPHTHALMIC

## 2022-06-11 MED ORDER — SIGHTPATH DOSE#1 BSS IO SOLN
INTRAOCULAR | Status: DC | PRN
  Administered 2022-06-11: 89 mL via OPHTHALMIC

## 2022-06-11 MED ORDER — FENTANYL CITRATE (PF) 100 MCG/2ML IJ SOLN
INTRAMUSCULAR | Status: DC | PRN
  Administered 2022-06-11: 50 ug via INTRAVENOUS

## 2022-06-11 MED ORDER — SIGHTPATH DOSE#1 NA CHONDROIT SULF-NA HYALURON 40-17 MG/ML IO SOLN
INTRAOCULAR | Status: DC | PRN
  Administered 2022-06-11: 1 mL via INTRAOCULAR

## 2022-06-11 MED ORDER — ONDANSETRON HCL 4 MG/2ML IJ SOLN
INTRAMUSCULAR | Status: DC | PRN
  Administered 2022-06-11: 4 mg via INTRAVENOUS

## 2022-06-11 MED ORDER — TETRACAINE HCL 0.5 % OP SOLN
1.0000 [drp] | OPHTHALMIC | Status: DC | PRN
  Administered 2022-06-11 (×3): 1 [drp] via OPHTHALMIC

## 2022-06-11 MED ORDER — MOXIFLOXACIN HCL 0.5 % OP SOLN
OPHTHALMIC | Status: DC | PRN
  Administered 2022-06-11: .2 mL via OPHTHALMIC

## 2022-06-11 MED ORDER — SIGHTPATH DOSE#1 BSS IO SOLN
INTRAOCULAR | Status: DC | PRN
  Administered 2022-06-11: 1 mL via INTRAMUSCULAR

## 2022-06-11 MED ORDER — ARMC OPHTHALMIC DILATING DROPS
1.0000 | OPHTHALMIC | Status: DC | PRN
  Administered 2022-06-11 (×3): 1 via OPHTHALMIC

## 2022-06-11 MED ORDER — SIGHTPATH DOSE#1 BSS IO SOLN
INTRAOCULAR | Status: DC | PRN
  Administered 2022-06-11: 30 mL

## 2022-06-11 SURGICAL SUPPLY — 18 items
ANGLE REVERSE CUT SHRT 25GA (CUTTER) ×1
CANNULA ANT/CHMB 27G (MISCELLANEOUS) IMPLANT
CANNULA ANT/CHMB 27GA (MISCELLANEOUS) IMPLANT
CATARACT SUITE SIGHTPATH (MISCELLANEOUS) ×1 IMPLANT
CYSTOTOME ANGL RVRS SHRT 25G (CUTTER) IMPLANT
CYSTOTOME ANGL RVRS SHRT 25GA (CUTTER) ×1 IMPLANT
DRAPE SHEET LG 3/4 BI-LAMINATE (DRAPES) IMPLANT
FEE CATARACT SUITE SIGHTPATH (MISCELLANEOUS) ×1 IMPLANT
GLOVE BIOGEL PI IND STRL 8 (GLOVE) ×1 IMPLANT
GLOVE SURG ENC TEXT LTX SZ8 (GLOVE) ×1 IMPLANT
LENS IOL TECNIS EYHANCE 13.5 (Intraocular Lens) IMPLANT
NDL FILTER BLUNT 18X1 1/2 (NEEDLE) ×1 IMPLANT
NEEDLE FILTER BLUNT 18X1 1/2 (NEEDLE) ×1 IMPLANT
PACK VIT ANT 23G (MISCELLANEOUS) IMPLANT
RING MALYGIN (MISCELLANEOUS) IMPLANT
SUT ETHILON 10-0 CS-B-6CS-B-6 (SUTURE)
SUTURE EHLN 10-0 CS-B-6CS-B-6 (SUTURE) IMPLANT
SYR 3ML LL SCALE MARK (SYRINGE) ×1 IMPLANT

## 2022-06-11 NOTE — Anesthesia Preprocedure Evaluation (Signed)
Anesthesia Evaluation  Patient identified by MRN, date of birth, ID band Patient awake    Reviewed: Allergy & Precautions, H&P , NPO status , Patient's Chart, lab work & pertinent test results  Airway Mallampati: III  TM Distance: >3 FB Neck ROM: Full    Dental  (+) Poor Dentition Very poor dentition:   Pulmonary former smoker   Pulmonary exam normal breath sounds clear to auscultation       Cardiovascular hypertension, Normal cardiovascular exam Rhythm:Regular Rate:Normal     Neuro/Psych negative neurological ROS  negative psych ROS   GI/Hepatic Neg liver ROS,GERD  ,,  Endo/Other  Hypothyroidism    Renal/GU negative Renal ROS  negative genitourinary   Musculoskeletal negative musculoskeletal ROS (+)    Abdominal   Peds negative pediatric ROS (+)  Hematology negative hematology ROS (+)   Anesthesia Other Findings Hypertension  GERD (gastroesophageal reflux disease) HLD (hyperlipidemia)  Hx of migraines Vitamin D deficiency  Hypothyroidism Colon cancer (HCC)  Hx COVID-19    Reproductive/Obstetrics negative OB ROS                             Anesthesia Physical Anesthesia Plan  ASA: 3  Anesthesia Plan: MAC   Post-op Pain Management:    Induction: Intravenous  PONV Risk Score and Plan:   Airway Management Planned: Natural Airway and Nasal Cannula  Additional Equipment:   Intra-op Plan:   Post-operative Plan:   Informed Consent: I have reviewed the patients History and Physical, chart, labs and discussed the procedure including the risks, benefits and alternatives for the proposed anesthesia with the patient or authorized representative who has indicated his/her understanding and acceptance.     Dental Advisory Given  Plan Discussed with: Anesthesiologist, CRNA and Surgeon  Anesthesia Plan Comments: (Patient consented for risks of anesthesia including but not  limited to:  - adverse reactions to medications - damage to eyes, teeth, lips or other oral mucosa - nerve damage due to positioning  - sore throat or hoarseness - Damage to heart, brain, nerves, lungs, other parts of body or loss of life  Patient voiced understanding.)       Anesthesia Quick Evaluation

## 2022-06-11 NOTE — H&P (Signed)
[facility]   Primary Care Physician:  Marina Goodell, MD Ophthalmologist: Dr. Maren Reamer  Pre-Procedure History & Physical: HPI:  Patricia Rose is a 67 y.o. female here for cataract surgery.   Past Medical History:  Diagnosis Date   Colon cancer (HCC) 2008   COVID-19 2023   GERD (gastroesophageal reflux disease)    HLD (hyperlipidemia)    Hx of migraines    Hypertension    Hypothyroidism    Vitamin D deficiency     Past Surgical History:  Procedure Laterality Date   ABDOMINAL HYSTERECTOMY     CATARACT EXTRACTION W/PHACO Left 05/28/2022   Procedure: CATARACT EXTRACTION PHACO AND INTRAOCULAR LENS PLACEMENT (IOC) LEFT  9.34  01:11.6;  Surgeon: Galen Manila, MD;  Location: [facility];  Service: Ophthalmology;  Laterality: Left;    Prior to Admission medications   Medication Sig Start Date End Date Taking? Authorizing Provider  amLODipine (NORVASC) 5 MG tablet Take 10 mg by mouth daily. 02/19/19  Yes [provider]  Cholecalciferol 50 MCG (2000 UT) CAPS Take by mouth.   Yes [provider]  gabapentin (NEURONTIN) 100 MG capsule Take by mouth. 11/11/17  Yes [provider]  levothyroxine (SYNTHROID) 50 MCG tablet TAKE 1 TABLET ON AN EMPTY STOMACH WITH A GLASS OF WATER AT LEAST 30 TO 60 MINUTES BEFORE BREAKFAST. 01/18/19  Yes [provider]  Magnesium 200 MG TABS Take by mouth daily.   Yes [provider]  metoprolol succinate (TOPROL-XL) 50 MG 24 hr tablet TAKE ONE (1) TABLET BY MOUTH ONCE DAILY 10/22/17  Yes [provider]  omeprazole (PRILOSEC) 40 MG capsule TAKE (1) CAPSULE BY MOUTH EVERY DAY 06/06/17  Yes [provider]  simvastatin (ZOCOR) 20 MG tablet TAKE ONE TABLET AT BEDTIME. 10/22/17  Yes [provider]    Allergies as of 05/08/2022   (No Known Allergies)    Family History  Problem Relation Age of Onset   Breast cancer Sister 92   Heart disease Mother    Asthma Father     Heart disease Father     Social History   Socioeconomic History   Marital status: Married    Spouse name: Not on file   Number of children: Not on file   Years of education: Not on file   Highest education level: Not on file  Occupational History   Not on file  Tobacco Use   Smoking status: Former    Types: Cigarettes    Quit date: 2020    Years since quitting: 4.3   Smokeless tobacco: Never  Vaping Use   Vaping Use: Never used  Substance and Sexual Activity   Alcohol use: Never   Drug use: Never   Sexual activity: Not on file  Other Topics Concern   Not on file  Social History Narrative   Not on file   Social Determinants of Health   Financial Resource Strain: Not on file  Food Insecurity: Not on file  Transportation Needs: Not on file  Physical Activity: Not on file  Stress: Not on file  Social Connections: Not on file  Intimate Partner Violence: Not on file    Review of Systems: See HPI, otherwise negative ROS  Physical Exam: BP (!) 129/96   Pulse 81   Temp 97.9 F (36.6 C) (Temporal)   Resp 18   Ht 5\' 5"  (1.651 m)   Wt 63 kg   SpO2 95%   BMI 23.13 kg/m  General:  Alert, cooperative in NAD Head:  Normocephalic and atraumatic. Respiratory:  Normal work of breathing. Cardiovascular:  RRR  Impression/Plan: Patricia Rose is here for cataract surgery.  Risks, benefits, limitations, and alternatives regarding cataract surgery have been reviewed with the patient.  Questions have been answered.  All parties agreeable.   Galen Manila, MD  06/11/2022, 9:05 AM

## 2022-06-11 NOTE — Transfer of Care (Signed)
Immediate Anesthesia Transfer of Care Note  Patient: Patricia Rose  Procedure(s) Performed: CATARACT EXTRACTION PHACO AND INTRAOCULAR LENS PLACEMENT (IOC) RIGHT (Right: Eye)  Patient Location: PACU  Anesthesia Type: MAC  Level of Consciousness: awake, alert  and patient cooperative  Airway and Oxygen Therapy: Patient Spontanous Breathing and Patient connected to supplemental oxygen  Post-op Assessment: Post-op Vital signs reviewed, Patient's Cardiovascular Status Stable, Respiratory Function Stable, Patent Airway and No signs of Nausea or vomiting  Post-op Vital Signs: Reviewed and stable  Complications: No notable events documented.

## 2022-06-11 NOTE — Op Note (Signed)
PREOPERATIVE DIAGNOSIS:  Nuclear sclerotic cataract of the right eye.   POSTOPERATIVE DIAGNOSIS:  H25.11 Nuclear CataractH25.041 Posterior Cataract   OPERATIVE PROCEDURE:ORPROCALL@   SURGEON:  Galen Manila, MD.   ANESTHESIA:  Anesthesiologist: Marisue Humble, MD CRNA: Barbette Hair, CRNA  1.      Managed anesthesia care. 2.      0.46ml of Shugarcaine was instilled in the eye following the paracentesis.   COMPLICATIONS:  None.   TECHNIQUE:   Stop and chop   DESCRIPTION OF PROCEDURE:  The patient was examined and consented in the preoperative holding area where the aforementioned topical anesthesia was applied to the right eye and then brought back to the Operating Room where the right eye was prepped and draped in the usual sterile ophthalmic fashion and a lid speculum was placed. A paracentesis was created with the side port blade and the anterior chamber was filled with viscoelastic. A near clear corneal incision was performed with the steel keratome. A continuous curvilinear capsulorrhexis was performed with a cystotome followed by the capsulorrhexis forceps. Hydrodissection and hydrodelineation were carried out with BSS on a blunt cannula. The lens was removed in a stop and chop  technique and the remaining cortical material was removed with the irrigation-aspiration handpiece. The capsular bag was inflated with viscoelastic and the Technis [id number]  lens was placed in the capsular bag without complication. The remaining viscoelastic was removed from the eye with the irrigation-aspiration handpiece. The wounds were hydrated. The anterior chamber was flushed with BSS and the eye was inflated to physiologic pressure. 0.31ml of Vigamox was placed in the anterior chamber. The wounds were found to be water tight. The eye was dressed with Combigan. The patient was given protective glasses to wear throughout the day and a shield with which to sleep tonight. The patient was also given drops with which  to begin a drop regimen today and will follow-up with me in one day. Implant Name Type Inv. Item Serial No. Manufacturer Lot No. LRB No. Used Action  LENS IOL TECNIS EYHANCE 13.5 - [id number] Intraocular Lens LENS IOL TECNIS EYHANCE 13.5 [id number] [employer]  Right 1 Implanted   Procedure(s) with comments: CATARACT EXTRACTION PHACO AND INTRAOCULAR LENS PLACEMENT (IOC) RIGHT (Right) - 7.45  00:57.5  Electronically signed: Galen Manila 06/11/2022 9:36 AM

## 2022-06-11 NOTE — Anesthesia Postprocedure Evaluation (Signed)
Anesthesia Post Note  Patient: Patricia Rose  Procedure(s) Performed: CATARACT EXTRACTION PHACO AND INTRAOCULAR LENS PLACEMENT (IOC) RIGHT (Right: Eye)  Patient location during evaluation: PACU Anesthesia Type: MAC Level of consciousness: awake and alert Pain management: pain level controlled Vital Signs Assessment: post-procedure vital signs reviewed and stable Respiratory status: spontaneous breathing, nonlabored ventilation, respiratory function stable and patient connected to nasal cannula oxygen Cardiovascular status: stable and blood pressure returned to baseline Postop Assessment: no apparent nausea or vomiting Anesthetic complications: no   No notable events documented.   Last Vitals:  Vitals:   06/11/22 0935 06/11/22 0939  BP: (!) 111/95 117/78  Pulse: 72 73  Resp: 13 15  Temp: (!) 36.4 C (!) 36.4 C  SpO2: 100% 95%    Last Pain:  Vitals:   06/11/22 0939  TempSrc:   PainSc: 0-No pain                 Marisue Humble

## 2022-06-12 ENCOUNTER — Encounter: Payer: Self-pay | Admitting: Ophthalmology

## 2022-10-16 ENCOUNTER — Other Ambulatory Visit: Payer: Self-pay | Admitting: Family Medicine

## 2022-10-16 DIAGNOSIS — Z1231 Encounter for screening mammogram for malignant neoplasm of breast: Secondary | ICD-10-CM

## 2022-12-03 ENCOUNTER — Ambulatory Visit
Admission: RE | Admit: 2022-12-03 | Discharge: 2022-12-03 | Disposition: A | Discharge status: Home / Self Care | Payer: BC Managed Care – PPO | Source: Ambulatory Visit | Attending: Family Medicine | Admitting: Family Medicine

## 2022-12-03 DIAGNOSIS — Z1231 Encounter for screening mammogram for malignant neoplasm of breast: Secondary | ICD-10-CM | POA: Diagnosis present

## 2023-02-05 HISTORY — PX: BREAST EXCISIONAL BIOPSY: SUR124

## 2023-05-05 IMAGING — MG MM DIGITAL SCREENING BILAT W/ TOMO AND CAD
8 series · 8 of 24 positions shown · non-contrast
Comparison: Previous exam(s).

CLINICAL DATA: Screening.

EXAM:
DIGITAL SCREENING BILATERAL MAMMOGRAM WITH TOMOSYNTHESIS AND CAD
TECHNIQUE: Bilateral screening digital craniocaudal and mediolateral oblique
mammograms were obtained. Bilateral screening digital breast
tomosynthesis was performed. The images were evaluated with
computer-aided detection.

[R CC synth-2D]
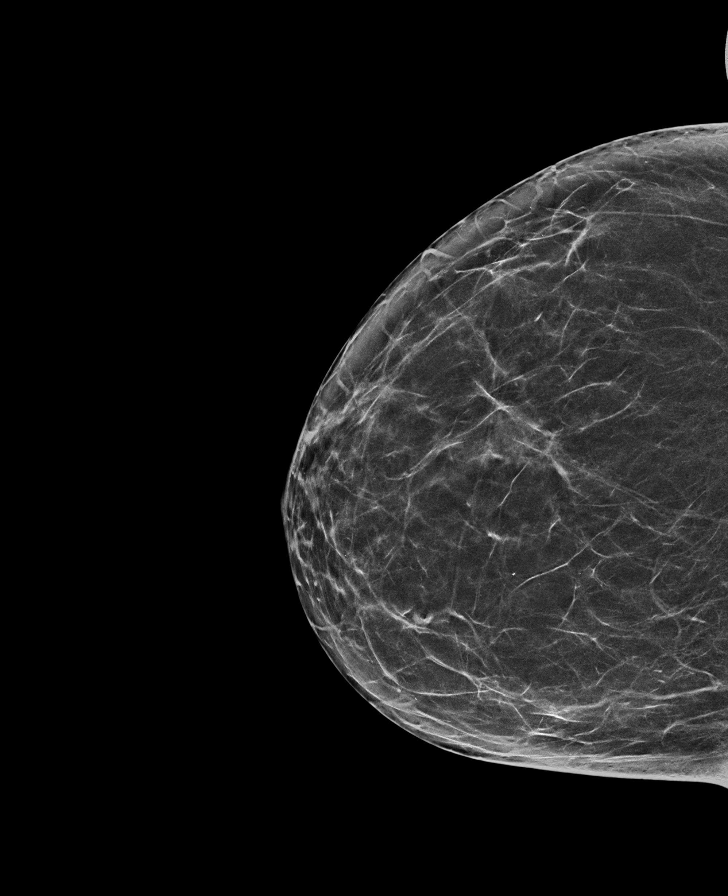

[L CC synth-2D]
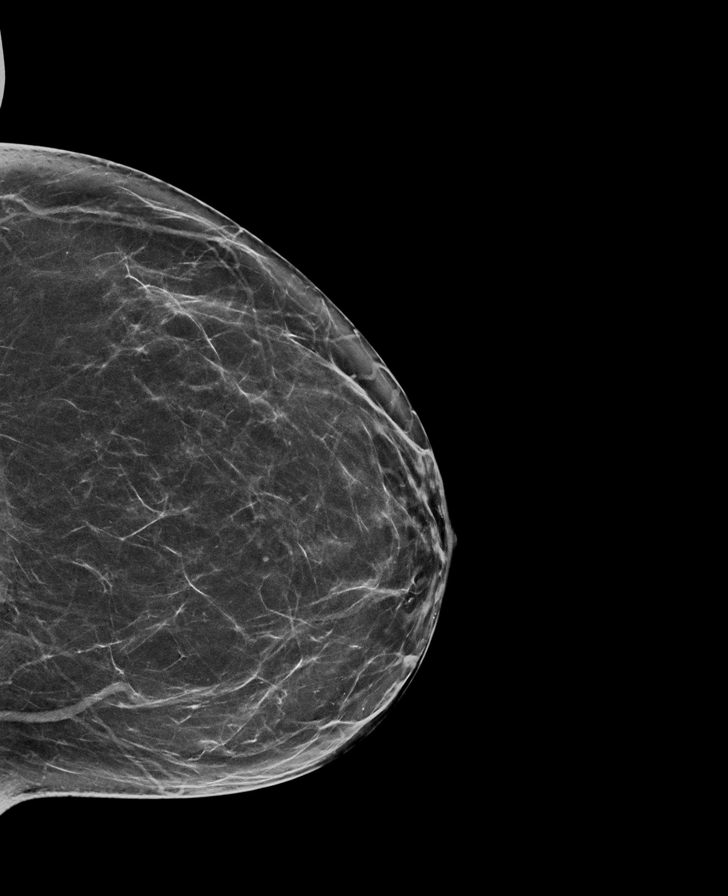

[L MLO synth-2D]
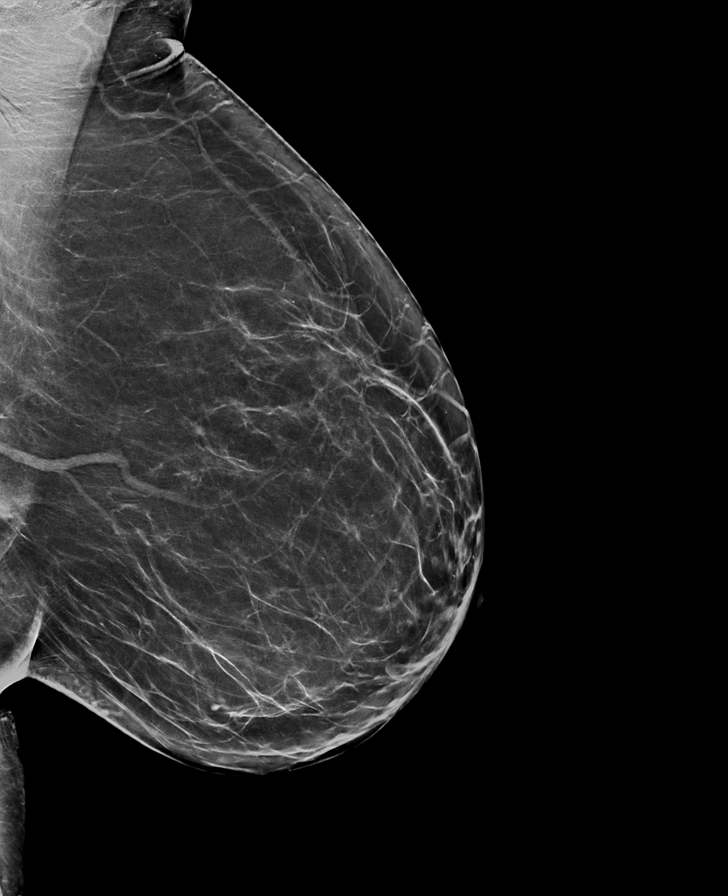

[R MLO synth-2D]
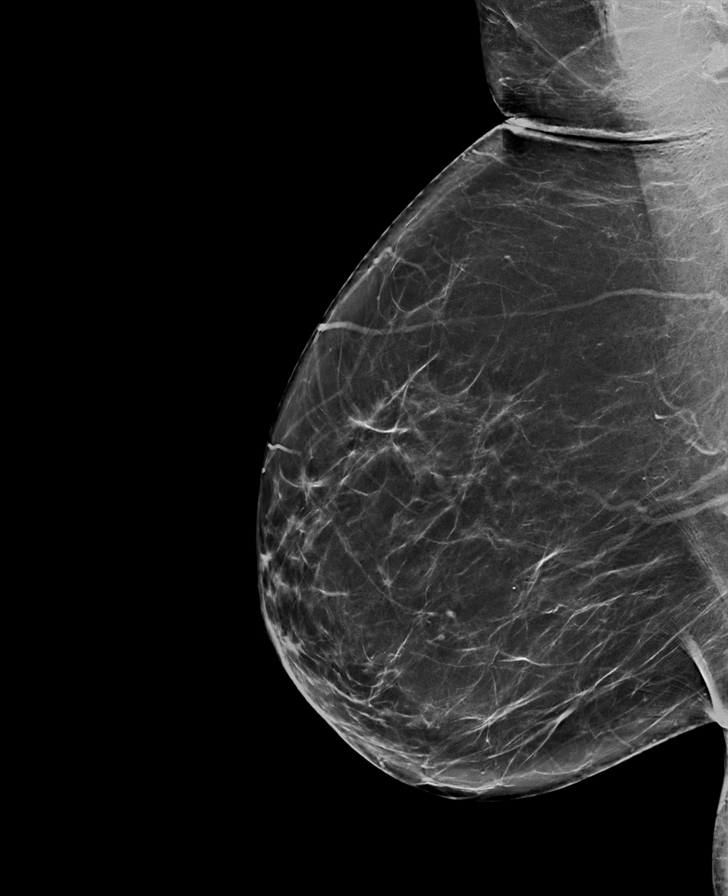

[R MLO tomo · tomo slice 39/77.0]
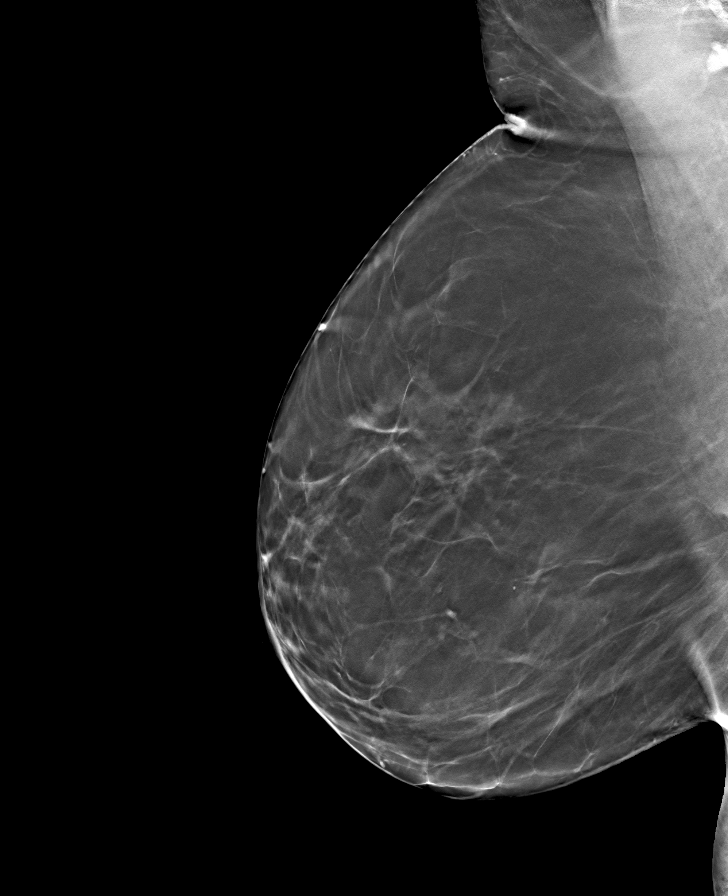

[L CC tomo · tomo slice 36/71.0]
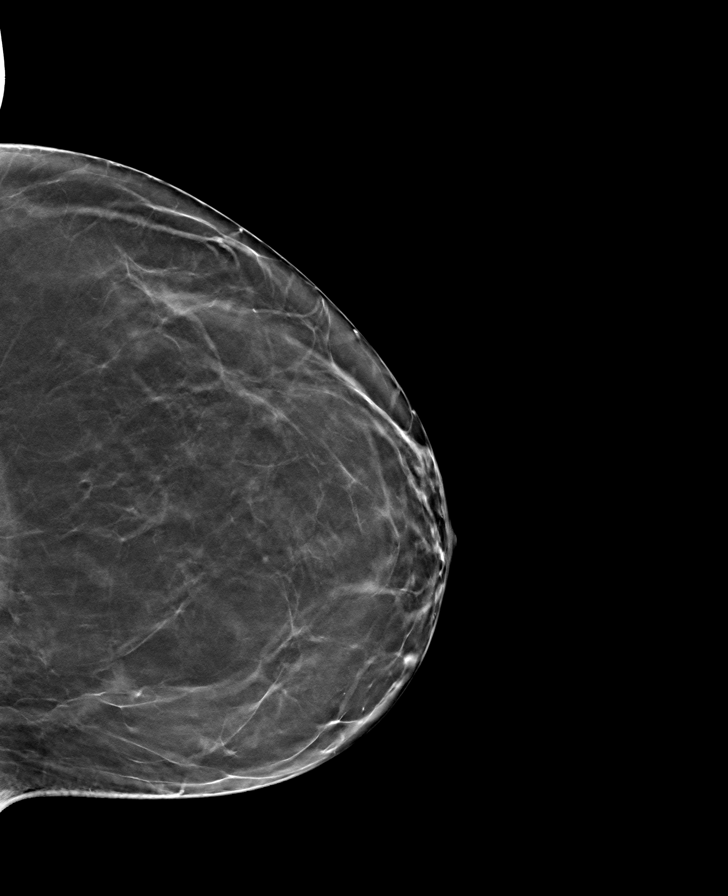

[L MLO tomo · tomo slice 38/75.0]
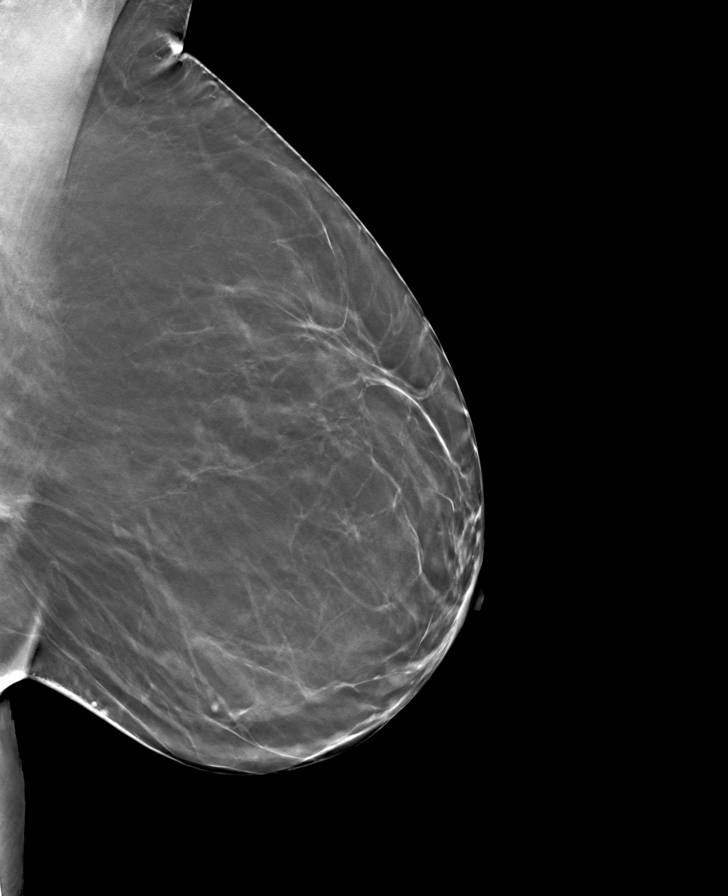

[R CC tomo · tomo slice 35/68.0]
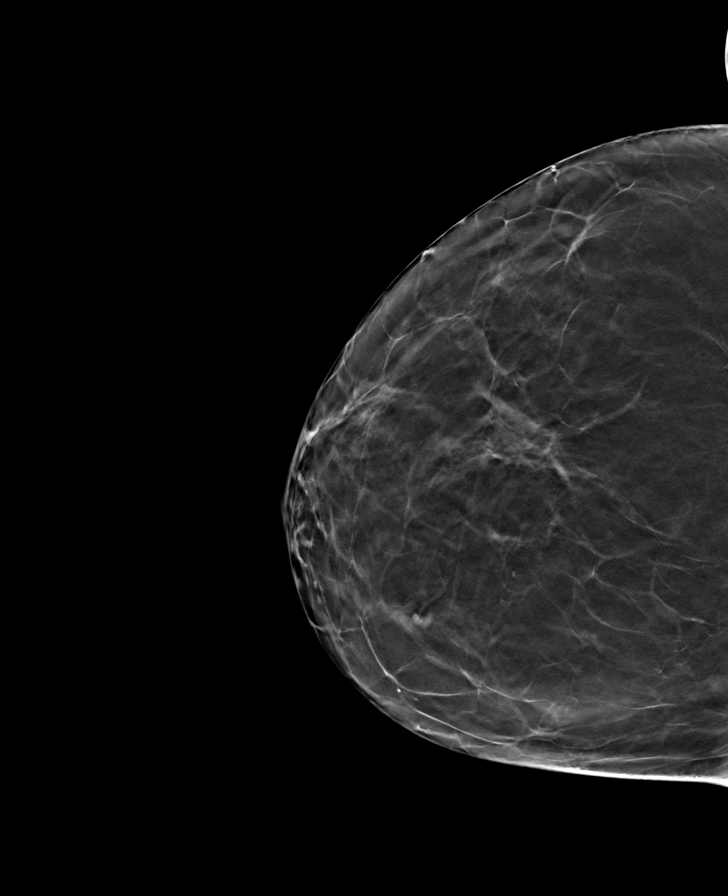

[8 of 24 positions shown; findings below may reference images not displayed]

ACR Breast Density Category b: There are scattered areas of
fibroglandular density.
FINDINGS: There are no findings suspicious for malignancy.
IMPRESSION: No mammographic evidence of malignancy. A result letter of this
screening mammogram will be mailed directly to the patient.

RECOMMENDATION:
Screening mammogram in one year. (Code:51-O-LD2)

BI-RADS CATEGORY  1: Negative.

## 2023-06-23 ENCOUNTER — Other Ambulatory Visit: Payer: Self-pay | Admitting: Family Medicine

## 2023-06-23 ENCOUNTER — Encounter: Payer: Self-pay | Admitting: Family Medicine

## 2023-06-23 DIAGNOSIS — R222 Localized swelling, mass and lump, trunk: Secondary | ICD-10-CM

## 2023-06-26 ENCOUNTER — Ambulatory Visit
Admission: RE | Admit: 2023-06-26 | Discharge: 2023-06-26 | Disposition: A | Discharge status: Home / Self Care | Source: Ambulatory Visit | Attending: Family Medicine | Admitting: Family Medicine

## 2023-06-26 DIAGNOSIS — R222 Localized swelling, mass and lump, trunk: Secondary | ICD-10-CM

## 2023-07-04 ENCOUNTER — Other Ambulatory Visit: Payer: Self-pay | Admitting: Family Medicine

## 2023-07-04 DIAGNOSIS — R928 Other abnormal and inconclusive findings on diagnostic imaging of breast: Secondary | ICD-10-CM

## 2023-07-08 ENCOUNTER — Ambulatory Visit
Admission: RE | Admit: 2023-07-08 | Discharge: 2023-07-08 | Disposition: A | Discharge status: Home / Self Care | Source: Ambulatory Visit | Attending: Family Medicine | Admitting: Family Medicine

## 2023-07-08 DIAGNOSIS — R928 Other abnormal and inconclusive findings on diagnostic imaging of breast: Secondary | ICD-10-CM

## 2023-07-08 MED ORDER — LIDOCAINE 1 % OPTIME INJ - NO CHARGE
2.0000 mL | Freq: Once | INTRAMUSCULAR | Status: AC
  Administered 2023-07-08: 2 mL
  Filled 2023-07-08: qty 2

## 2023-07-08 MED ORDER — LIDOCAINE-EPINEPHRINE (PF) 1 %-1:200000 IJ SOLN
10.0000 mL | Freq: Once | INTRAMUSCULAR | Status: AC
  Administered 2023-07-08: 10 mL
  Filled 2023-07-08: qty 10

## 2023-07-09 ENCOUNTER — Encounter: Payer: Self-pay | Admitting: *Deleted

## 2023-07-09 LAB — SURGICAL PATHOLOGY

## 2023-07-09 NOTE — Progress Notes (Signed)
 Referral recieved from [facility] for benign breast mass. She will see Dr. Charmel Cooter on 6/27 at 8:15 (patient requested this date)

## 2023-08-28 ENCOUNTER — Ambulatory Visit: Payer: Self-pay | Admitting: General Surgery

## 2023-08-29 ENCOUNTER — Other Ambulatory Visit: Payer: Self-pay

## 2023-08-29 ENCOUNTER — Encounter
Admission: RE | Admit: 2023-08-29 | Discharge: 2023-08-29 | Disposition: A | Discharge status: Home / Self Care | Source: Ambulatory Visit | Attending: General Surgery | Admitting: General Surgery

## 2023-08-29 VITALS — Ht 64.0 in | Wt 127.0 lb

## 2023-08-29 DIAGNOSIS — I10 Essential (primary) hypertension: Secondary | ICD-10-CM

## 2023-08-29 HISTORY — DX: Pure hypercholesterolemia, unspecified: E78.00

## 2023-08-29 HISTORY — DX: Hypothyroidism, unspecified: E03.9

## 2023-08-29 HISTORY — DX: Chronic obstructive pulmonary disease, unspecified: J44.9

## 2023-08-29 HISTORY — DX: Chronic kidney disease, stage 3a: N18.31

## 2023-08-29 HISTORY — DX: Essential (primary) hypertension: I10

## 2023-08-29 NOTE — Patient Instructions (Addendum)
 Your procedure is scheduled on: Monday 09/01/23 Report to the Registration Desk on the 1st floor of the [facility]. To find out your arrival time, please call [phone] between 1PM - 3PM on: Friday 08/29/23 If your arrival time is 6:00 am, do not arrive before that time as the [facility] entrance doors do not open until 6:00 am.  REMEMBER: Instructions that are not followed completely may result in serious medical risk, up to and including death; or upon the discretion of your surgeon and anesthesiologist your surgery may need to be rescheduled.  Do not eat food after midnight the night before surgery.  No gum chewing or hard candies.  One week prior to surgery: Stop Anti-inflammatories (NSAIDS) such as Advil, Aleve, Ibuprofen, Motrin, Naproxen, Naprosyn and Aspirin based products such as Excedrin, Goody's Powder, BC Powder. Stop ANY OVER THE COUNTER supplements until after surgery.  You may however, continue to take Tylenol if needed for pain up until the day of surgery.  Continue taking all of your other prescription medications up until the day of surgery.  ON THE DAY OF SURGERY ONLY TAKE THESE MEDICATIONS WITH SIPS OF WATER:  amLODipine (NORVASC) 5 MG  levothyroxine (SYNTHROID) 50 MCG  metoprolol succinate (TOPROL-XL) 50 MG  omeprazole (PRILOSEC) 40 M   No Alcohol for 24 hours before or after surgery.  No Smoking including e-cigarettes for 24 hours before surgery.  No chewable tobacco products for at least 6 hours before surgery.  No nicotine patches on the day of surgery.  Do not use any recreational drugs for at least a week (preferably 2 weeks) before your surgery.  Please be advised that the combination of cocaine and anesthesia may have negative outcomes, up to and including death. If you test positive for cocaine, your surgery will be cancelled.  On the morning of surgery brush your teeth with toothpaste and water, you may rinse your mouth with mouthwash if you  wish. Do not swallow any toothpaste or mouthwash.  Take a fresh shower the morning of your surgery use antibacterial soap.  Do not wear jewelry, make-up, hairpins, clips or nail polish.  For welded (permanent) jewelry: bracelets, anklets, waist bands, etc.  Please have this removed prior to surgery.  If it is not removed, there is a chance that hospital personnel will need to cut it off on the day of surgery.  Do not wear lotions, powders, or perfumes.   Do not shave body hair from the neck down 48 hours before surgery.  Contact lenses, hearing aids and dentures may not be worn into surgery.  Do not bring valuables to the hospital. [facility] is not responsible for any missing/lost belongings or valuables.   Notify your doctor if there is any change in your medical condition (cold, fever, infection).  Wear comfortable clothing (specific to your surgery type) to the hospital.  After surgery, you can help prevent lung complications by doing breathing exercises.  Take deep breaths and cough every 1-2 hours. Your doctor may order a device called an Incentive Spirometer to help you take deep breaths. When coughing or sneezing, hold a pillow firmly against your incision with both hands. This is called "splinting." Doing this helps protect your incision. It also decreases belly discomfort.  If you are being admitted to the hospital overnight, leave your suitcase in the car. After surgery it may be brought to your room.  In case of increased patient census, it may be necessary for you, the patient, to  continue your postoperative care in the Same Day Surgery department.  If you are being discharged the day of surgery, you will not be allowed to drive home. You will need a responsible individual to drive you home and stay with you for 24 hours after surgery.   If you are taking public transportation, you will need to have a responsible individual with you.  Please call the Pre-admissions  Testing Dept. at [phone] if you have any questions about these instructions.  Surgery Visitation Policy:  Patients having surgery or a procedure may have two visitors.  Children under the age of 1 must have an adult with them who is not the patient.  Inpatient Visitation:    Visiting hours are 7 a.m. to 8 p.m. Up to four visitors are allowed at one time in a patient room. The visitors may rotate out with other people during the day.  One visitor age 74 or older may stay with the patient overnight and must be in the room by 8 p.m.   Merchandiser, retail to address health-related social needs:  [email or web address]     Preparing for Surgery with CHLORHEXIDINE GLUCONATE (CHG) Soap  Chlorhexidine Gluconate (CHG) Soap  o An antiseptic cleaner that kills germs and bonds with the skin to continue killing germs even after washing  o Used for showering the night before surgery and morning of surgery  Before surgery, you can play an important role by reducing the number of germs on your skin.  CHG (Chlorhexidine gluconate) soap is an antiseptic cleanser which kills germs and bonds with the skin to continue killing germs even after washing.  Please do not use if you have an allergy to CHG or antibacterial soaps. If your skin becomes reddened/irritated stop using the CHG.  1. Shower the NIGHT BEFORE SURGERY and the MORNING OF SURGERY with CHG soap.  2. If you choose to wash your hair, wash your hair first as usual with your normal shampoo.  3. After shampooing, rinse your hair and body thoroughly to remove the shampoo.  4. Use CHG as you would any other liquid soap. You can apply CHG directly to the skin and wash gently with a scrungie or a clean washcloth.  5. Apply the CHG soap to your body only from the neck down. Do not use on open wounds or open sores. Avoid contact with your eyes, ears, mouth, and genitals (private parts). Wash face and genitals (private  parts) with your normal soap.  6. Wash thoroughly, paying special attention to the area where your surgery will be performed.  7. Thoroughly rinse your body with warm water.  8. Do not shower/wash with your normal soap after using and rinsing off the CHG soap.  9. Pat yourself dry with a clean towel.  10. Wear clean pajamas to bed the night before surgery.  12. Place clean sheets on your bed the night of your first shower and do not sleep with pets.  13. Shower again with the CHG soap on the day of surgery prior to arriving at the hospital.  14. Do not apply any deodorants/lotions/powders.  15. Please wear clean clothes to the hospital.

## 2023-09-01 ENCOUNTER — Ambulatory Visit: Admitting: Certified Registered"

## 2023-09-01 ENCOUNTER — Encounter: Payer: Self-pay | Admitting: General Surgery

## 2023-09-01 ENCOUNTER — Other Ambulatory Visit: Payer: Self-pay

## 2023-09-01 ENCOUNTER — Ambulatory Visit
Admission: RE | Admit: 2023-09-01 | Discharge: 2023-09-01 | Disposition: A | Discharge status: Home / Self Care | Attending: General Surgery | Admitting: General Surgery

## 2023-09-01 ENCOUNTER — Encounter
Admission: RE | Disposition: A | Discharge status: Home / Self Care | Payer: Self-pay | Source: Home / Self Care | Attending: General Surgery

## 2023-09-01 DIAGNOSIS — I10 Essential (primary) hypertension: Secondary | ICD-10-CM | POA: Insufficient documentation

## 2023-09-01 DIAGNOSIS — D1721 Benign lipomatous neoplasm of skin and subcutaneous tissue of right arm: Secondary | ICD-10-CM | POA: Diagnosis present

## 2023-09-01 DIAGNOSIS — K219 Gastro-esophageal reflux disease without esophagitis: Secondary | ICD-10-CM | POA: Insufficient documentation

## 2023-09-01 DIAGNOSIS — J449 Chronic obstructive pulmonary disease, unspecified: Secondary | ICD-10-CM | POA: Diagnosis not present

## 2023-09-01 DIAGNOSIS — E039 Hypothyroidism, unspecified: Secondary | ICD-10-CM | POA: Insufficient documentation

## 2023-09-01 DIAGNOSIS — Z87891 Personal history of nicotine dependence: Secondary | ICD-10-CM | POA: Diagnosis not present

## 2023-09-01 LAB — CBC
HCT: 42.8 % (ref 36.0–46.0)
Hemoglobin: 13.9 g/dL (ref 12.0–15.0)
MCH: 30.2 pg (ref 26.0–34.0)
MCHC: 32.5 g/dL (ref 30.0–36.0)
MCV: 92.8 fL (ref 80.0–100.0)
Platelets: 283 K/uL (ref 150–400)
RBC: 4.61 MIL/uL (ref 3.87–5.11)
RDW: 13.7 % (ref 11.5–15.5)
WBC: 4.7 K/uL (ref 4.0–10.5)
nRBC: 0 % (ref 0.0–0.2)

## 2023-09-01 SURGERY — EXCISION, MASS, BREAST
Anesthesia: General | Site: Axilla | Laterality: Right

## 2023-09-01 MED ORDER — CHLORHEXIDINE GLUCONATE 0.12 % MT SOLN
OROMUCOSAL | Status: AC
  Filled 2023-09-01: qty 15

## 2023-09-01 MED ORDER — FENTANYL CITRATE (PF) 100 MCG/2ML IJ SOLN: 25.0000 ug | INTRAMUSCULAR | Status: DC | PRN

## 2023-09-01 MED ORDER — PROPOFOL 10 MG/ML IV BOLUS
INTRAVENOUS | Status: DC | PRN
Start: 2023-09-01 — End: 2023-09-01
  Administered 2023-09-01: 130 mg via INTRAVENOUS

## 2023-09-01 MED ORDER — OXYCODONE HCL 5 MG/5ML PO SOLN: 5.0000 mg | Freq: Once | ORAL | Status: DC | PRN

## 2023-09-01 MED ORDER — BUPIVACAINE-EPINEPHRINE (PF) 0.5% -1:200000 IJ SOLN
INTRAMUSCULAR | Status: AC
  Filled 2023-09-01: qty 30

## 2023-09-01 MED ORDER — HYDROCODONE-ACETAMINOPHEN 5-325 MG PO TABS: 1.0000 | ORAL_TABLET | Freq: Four times a day (QID) | ORAL | 0 refills | Status: AC | PRN

## 2023-09-01 MED ORDER — ACETAMINOPHEN 10 MG/ML IV SOLN
INTRAVENOUS | Status: DC | PRN
  Administered 2023-09-01: 1000 mg via INTRAVENOUS

## 2023-09-01 MED ORDER — ORAL CARE MOUTH RINSE: 15.0000 mL | Freq: Once | OROMUCOSAL | Status: AC

## 2023-09-01 MED ORDER — ONDANSETRON HCL 4 MG/2ML IJ SOLN
INTRAMUSCULAR | Status: DC | PRN
  Administered 2023-09-01: 4 mg via INTRAVENOUS

## 2023-09-01 MED ORDER — FENTANYL CITRATE (PF) 100 MCG/2ML IJ SOLN
INTRAMUSCULAR | Status: AC
  Filled 2023-09-01: qty 2

## 2023-09-01 MED ORDER — OXYCODONE HCL 5 MG PO TABS: 5.0000 mg | ORAL_TABLET | Freq: Once | ORAL | Status: DC | PRN

## 2023-09-01 MED ORDER — LACTATED RINGERS IV SOLN: INTRAVENOUS | Status: DC

## 2023-09-01 MED ORDER — MIDAZOLAM HCL 2 MG/2ML IJ SOLN
INTRAMUSCULAR | Status: DC | PRN
  Administered 2023-09-01: 1 mg via INTRAVENOUS

## 2023-09-01 MED ORDER — ACETAMINOPHEN 10 MG/ML IV SOLN: 1000.0000 mg | Freq: Once | INTRAVENOUS | Status: DC | PRN

## 2023-09-01 MED ORDER — GLYCOPYRROLATE 0.2 MG/ML IJ SOLN
INTRAMUSCULAR | Status: DC | PRN
  Administered 2023-09-01: .2 mg via INTRAVENOUS

## 2023-09-01 MED ORDER — CEFAZOLIN SODIUM-DEXTROSE 2-4 GM/100ML-% IV SOLN
INTRAVENOUS | Status: AC
  Filled 2023-09-01: qty 100

## 2023-09-01 MED ORDER — ACETAMINOPHEN 10 MG/ML IV SOLN
INTRAVENOUS | Status: AC
  Filled 2023-09-01: qty 100

## 2023-09-01 MED ORDER — FENTANYL CITRATE (PF) 100 MCG/2ML IJ SOLN
INTRAMUSCULAR | Status: DC | PRN
  Administered 2023-09-01 (×2): 25 ug via INTRAVENOUS
  Administered 2023-09-01: 50 ug via INTRAVENOUS

## 2023-09-01 MED ORDER — PHENYLEPHRINE HCL (PRESSORS) 10 MG/ML IV SOLN
INTRAVENOUS | Status: DC | PRN
  Administered 2023-09-01 (×2): 80 ug via INTRAVENOUS
  Administered 2023-09-01 (×2): 40 ug via INTRAVENOUS
  Administered 2023-09-01: 240 ug via INTRAVENOUS

## 2023-09-01 MED ORDER — CEFAZOLIN SODIUM-DEXTROSE 2-4 GM/100ML-% IV SOLN
2.0000 g | INTRAVENOUS | Status: AC
  Administered 2023-09-01: 2 g via INTRAVENOUS

## 2023-09-01 MED ORDER — LIDOCAINE HCL (CARDIAC) PF 100 MG/5ML IV SOSY
PREFILLED_SYRINGE | INTRAVENOUS | Status: DC | PRN
  Administered 2023-09-01: 60 mg via INTRAVENOUS

## 2023-09-01 MED ORDER — CHLORHEXIDINE GLUCONATE 0.12 % MT SOLN
15.0000 mL | Freq: Once | OROMUCOSAL | Status: AC
  Administered 2023-09-01: 15 mL via OROMUCOSAL

## 2023-09-01 MED ORDER — MIDAZOLAM HCL 2 MG/2ML IJ SOLN
INTRAMUSCULAR | Status: AC
  Filled 2023-09-01: qty 2

## 2023-09-01 MED ORDER — DEXAMETHASONE SODIUM PHOSPHATE 10 MG/ML IJ SOLN
INTRAMUSCULAR | Status: DC | PRN
  Administered 2023-09-01: 8 mg via INTRAVENOUS

## 2023-09-01 MED ORDER — PROPOFOL 10 MG/ML IV BOLUS
INTRAVENOUS | Status: AC
  Filled 2023-09-01: qty 20

## 2023-09-01 MED ORDER — BUPIVACAINE-EPINEPHRINE 0.5% -1:200000 IJ SOLN
INTRAMUSCULAR | Status: DC | PRN
Start: 2023-09-01 — End: 2023-09-01
  Administered 2023-09-01: 30 mL

## 2023-09-01 MED ORDER — DROPERIDOL 2.5 MG/ML IJ SOLN: 0.6250 mg | Freq: Once | INTRAMUSCULAR | Status: DC | PRN

## 2023-09-01 SURGICAL SUPPLY — 21 items
CHLORAPREP W/TINT 26 (MISCELLANEOUS) IMPLANT
DERMABOND ADVANCED .7 DNX12 (GAUZE/BANDAGES/DRESSINGS) ×1 IMPLANT
DRAPE LAPAROTOMY 100X77 ABD (DRAPES) ×1 IMPLANT
ELECTRODE REM PT RTRN 9FT ADLT (ELECTROSURGICAL) ×1 IMPLANT
GLOVE BIO SURGEON STRL SZ 6.5 (GLOVE) ×1 IMPLANT
GLOVE BIOGEL PI IND STRL 6.5 (GLOVE) ×1 IMPLANT
GLOVE SURG SYN 6.5 PF PI (GLOVE) ×2 IMPLANT
GOWN STRL REUS W/ TWL LRG LVL3 (GOWN DISPOSABLE) ×3 IMPLANT
KIT TURNOVER KIT A (KITS) ×1 IMPLANT
LABEL OR SOLS (LABEL) ×1 IMPLANT
MANIFOLD NEPTUNE II (INSTRUMENTS) ×1 IMPLANT
NDL HYPO 25X1 1.5 SAFETY (NEEDLE) ×1 IMPLANT
NEEDLE HYPO 25X1 1.5 SAFETY (NEEDLE) ×1 IMPLANT
NS IRRIG 500ML POUR BTL (IV SOLUTION) ×1 IMPLANT
PACK BASIN MINOR ARMC (MISCELLANEOUS) ×1 IMPLANT
SUT ETHILON 3-0 (SUTURE) IMPLANT
SUT VIC AB 3-0 SH 27X BRD (SUTURE) ×1 IMPLANT
SUTURE MNCRL 4-0 27XMF (SUTURE) ×1 IMPLANT
SYR 10ML LL (SYRINGE) ×1 IMPLANT
TRAP FLUID SMOKE EVACUATOR (MISCELLANEOUS) ×1 IMPLANT
WATER STERILE IRR 500ML POUR (IV SOLUTION) ×1 IMPLANT

## 2023-09-01 NOTE — H&P (Signed)
 History of Present Illness Patricia Rose is a 68 year old female who presents with a right axillary lipoma.  She has had a mass in her right axillary area for several years, which has started to increase in size over the past six to seven months. There is no associated pain, previous infection, or major issues with the mass. The growth of the mass is her primary concern.  The mass does not interfere with her clothing or any activities.   Patient had diagnostic mammogram on 06/26/2023 showing a 7.3 x 3.0 x 6.4 cm mass in the right axilla. The mass favor a lipoma but core biopsy was done on July 08, 2023. Biopsy result shows mature adipose tissue. I personally evaluated the images.  No pain or previous infection related to the mass.   PAST MEDICAL HISTORY:  Past Medical History:  Diagnosis Date  Benign neoplasm of colon  GERD (gastroesophageal reflux disease)  Graves disease  Dr. Clarissa  Hyperlipidemia  Hypertension  Hypothyroidism (acquired)  Migraines  Toxic nodular goiter  Vitamin D deficiency     PAST SURGICAL HISTORY:  Past Surgical History:  Procedure Laterality Date  HYSTERECTOMY 2004  COLON SURGERY 2008  laparoscopic colectomy  COLONOSCOPY 11/17/2018  Tubular adenoma of the colon/Repeat 70yrs/TKT  Colon @ [facility] 05/07/2022  Tubulvillous adenoma/Repeat 3 yrs/TKT    MEDICATIONS:  Outpatient Encounter Medications as of 08/01/2023  Medication Sig Dispense Refill  albuterol MDI, PROVENTIL, VENTOLIN, PROAIR, HFA 90 mcg/actuation inhaler Inhale 2 inhalations into the lungs every 4 (four) hours as needed 1 each 1  amLODIPine (NORVASC) 10 MG tablet TAKE 1 TABLET BY MOUTH EVERY DAY 90 tablet 1  cholecalciferol (VITAMIN D3) 2,000 unit capsule Take 2,000 Units by mouth once daily  levothyroxine (SYNTHROID) 50 MCG tablet TAKE 1 TABLET EVERY MORNING BEFORE BREAKFAST WITH GLASS OF WATER AT LEAST 30-60 MIN BEFORE BREAKFAST 90 tablet 3  magnesium 200 mg Take by mouth once daily   metoprolol SUCCinate (TOPROL-XL) 50 MG XL tablet TAKE 1 TABLET BY MOUTH EVERY DAY 90 tablet 3  omeprazole (PRILOSEC OTC) 20 MG EC tablet Take 40 mg by mouth once daily  simvastatin (ZOCOR) 20 MG tablet TAKE 1 TABLET BY MOUTH EVERY DAY AT NIGHT 90 tablet 3  [DISCONTINUED] omeprazole (PRILOSEC) 40 MG DR capsule Take 1 capsule (40 mg total) by mouth once daily 90 capsule 3   No facility-administered encounter medications on file as of 08/01/2023.    ALLERGIES:  Patient has no known allergies.  SOCIAL HISTORY:  Social History   Socioeconomic History  Marital status: Married  Tobacco Use  Smoking status: Former  Current packs/day: 0.00  Types: Cigarettes  Quit date: 11/27/2018  Years since quitting: 4.6  Smokeless tobacco: Never  Vaping Use  Vaping status: Never Used  Substance and Sexual Activity  Alcohol use: No  Drug use: No   FAMILY HISTORY:  Family History  Problem Relation Name Age of Onset  Heart disease Mother  High blood pressure (Hypertension) Mother  Breast cancer Sister  High blood pressure (Hypertension) Maternal Aunt    GENERAL REVIEW OF SYSTEMS:   General ROS: negative for - chills, fatigue, fever, weight gain or weight loss Allergy and Immunology ROS: negative for - hives  Hematological and Lymphatic ROS: negative for - bleeding problems or bruising, negative for palpable nodes Endocrine ROS: negative for - heat or cold intolerance, hair changes Respiratory ROS: negative for - cough, shortness of breath or wheezing Cardiovascular ROS: no chest pain or palpitations  GI ROS: negative for nausea, vomiting, abdominal pain, diarrhea, constipation Musculoskeletal ROS: negative for - joint swelling or muscle pain Neurological ROS: negative for - confusion, syncope Dermatological ROS: negative for pruritus and rash  PHYSICAL EXAM:  Vitals:  08/01/23 0801  BP: (!) 145/94  Pulse: 75  .  Ht:165.1 cm (5' 5) Wt:57.6 kg (126 lb 15.8 oz) ADJ:Anib surface area is  1.63 meters squared. Body mass index is 21.13 kg/m.[name]  GENERAL: Alert, active, oriented x3  HEENT: Pupils equal reactive to light. Extraocular movements are intact. Sclera clear. Palpebral conjunctiva normal red color.Pharynx clear.  NECK: Supple with no palpable mass and no adenopathy.  LUNGS: Sound clear with no rales rhonchi or wheezes.  HEART: Regular rhythm S1 and S2 without murmur.  ABDOMEN: Soft and depressible, nontender with no palpable mass, no hepatomegaly. Wounds dry and clean.  EXTREMITIES: Well-developed well-nourished symmetrical with no dependent edema. There is a palpable, oval-shaped mass in the right axilla, mobile, deep.  NEUROLOGICAL: Awake alert oriented, facial expression symmetrical, moving all extremities.  Results PATHOLOGY Biopsy: Adipose tissue  Assessment & Plan Right axillary lipoma  The right axillary lipoma has been present for several years, with a recent increase in size over the last 6-7 months. It is painless, with no infection or major issues. Biopsy confirms it is a benign mass of adipose tissue. Recommend surgical excision due to its size and potential for further growth since he has been constantly growing in the last few months. The procedure requires an operating room setting with sedation and local anesthesia. Discuss surgical options, including risks and benefits.  Advise on post-operative care: avoid hyperextension or elevation of the arm initially, expect mild soreness, and limit driving until comfortable and not on pain medications. Instruct her to contact the office to schedule surgery once she decides.  Lipoma of right axilla [Patricia Rose]   Patient verbalized understanding, all questions were answered, and were agreeable with the plan outlined above.   Lucas Sjogren, MD

## 2023-09-01 NOTE — Anesthesia Procedure Notes (Signed)
 Procedure Name: LMA Insertion Date/Time: 09/01/2023 8:45 AM  Performed by: Rosine Shona Jansky, CRNAPre-anesthesia Checklist: Patient identified, Suction available, Patient being monitored, Timeout performed and Emergency Drugs available Patient Re-evaluated:Patient Re-evaluated prior to induction Oxygen Delivery Method: Circle system utilized Preoxygenation: Pre-oxygenation with 100% oxygen Induction Type: IV induction LMA: LMA inserted Number of attempts: 1 Tube secured with: Tape Dental Injury: Teeth and Oropharynx as per pre-operative assessment

## 2023-09-01 NOTE — Op Note (Signed)
 OPERATION REPORT  Pre Operative Diagnosis: Right axillary mass  Post operative diagnosis: Right  axillary mass  Anesthesia: MAC and Local   Surgeon: Dr. Rodolph   Indication: This 68 y.o. year old female with a soft tissue mass on the right axillary that is increasing in size.   Description of procedure: after orienting patient about the procedure steps and benefits and patient agreed to proceed. Time out was done identifying correct patient and location of procedure. After induction of general anesthesia, local anesthesia was infiltrated around the palpable lesion. With a blade #15, an elliptical incision was made using the skin lines. Sharp dissection was carried down deep down into the deltoid and pectoralis muscle. The mass measured 12 cm and removed completely. Deep dermal stitches were done with vicryl 3-0 to repair the laceration and skin closed with Monocryl 4-0 in subcuticular fashion. Specimen sent to pathology.    Complications: none   EBL: minimal  Lucas Rodolph, MD, FACS

## 2023-09-01 NOTE — Discharge Instructions (Signed)

## 2023-09-01 NOTE — Anesthesia Preprocedure Evaluation (Signed)
 Anesthesia Evaluation  Patient identified by MRN, date of birth, ID band Patient awake    Reviewed: Allergy & Precautions, H&P , NPO status , Patient's Chart, lab work & pertinent test results, reviewed documented beta blocker date and time   Airway Mallampati: II  TM Distance: >3 FB Neck ROM: full    Dental  (+) Teeth Intact   Pulmonary COPD, former smoker   Pulmonary exam normal        Cardiovascular Exercise Tolerance: Good hypertension, On Medications negative cardio ROS Normal cardiovascular exam Rate:Normal     Neuro/Psych negative neurological ROS  negative psych ROS   GI/Hepatic Neg liver ROS,GERD  Medicated,,  Endo/Other  Hypothyroidism    Renal/GU Renal disease  negative genitourinary   Musculoskeletal   Abdominal   Peds  Hematology negative hematology ROS (+)   Anesthesia Other Findings   Reproductive/Obstetrics negative OB ROS                              Anesthesia Physical Anesthesia Plan  ASA: 3  Anesthesia Plan: General LMA   Post-op Pain Management:    Induction:   PONV Risk Score and Plan: 4 or greater  Airway Management Planned:   Additional Equipment:   Intra-op Plan:   Post-operative Plan:   Informed Consent: I have reviewed the patients History and Physical, chart, labs and discussed the procedure including the risks, benefits and alternatives for the proposed anesthesia with the patient or authorized representative who has indicated his/her understanding and acceptance.       Plan Discussed with: CRNA  Anesthesia Plan Comments:         Anesthesia Quick Evaluation

## 2023-09-01 NOTE — Transfer of Care (Signed)
 Immediate Anesthesia Transfer of Care Note  Patient: Patricia Rose  Procedure(s) Performed: EXCISION, MASS, BREAST (Right: Axilla)  Patient Location: PACU  Anesthesia Type:General  Level of Consciousness: drowsy, patient cooperative, and responds to stimulation  Airway & Oxygen Therapy: Patient Spontanous Breathing and Patient connected to face mask  Post-op Assessment: Report given to RN and Post -op Vital signs reviewed and stable  Post vital signs: Reviewed  Last Vitals:  Vitals Value Taken Time  BP 135/85 09/01/23 09:28  Temp    Pulse 101 09/01/23 09:30  Resp 11 09/01/23 09:30  SpO2 100 % 09/01/23 09:30  Vitals shown include unfiled device data.  Last Pain:  Vitals:   09/01/23 0728  TempSrc: Temporal  PainSc: 0-No pain         Complications: No notable events documented.

## 2023-09-02 LAB — SURGICAL PATHOLOGY

## 2023-09-03 NOTE — Anesthesia Postprocedure Evaluation (Signed)
 Anesthesia Post Note  Patient: Patricia Rose  Procedure(s) Performed: EXCISION, MASS, BREAST (Right: Axilla)  Patient location during evaluation: PACU Anesthesia Type: General Level of consciousness: awake and alert Pain management: pain level controlled Vital Signs Assessment: post-procedure vital signs reviewed and stable Respiratory status: spontaneous breathing, nonlabored ventilation, respiratory function stable and patient connected to nasal cannula oxygen Cardiovascular status: blood pressure returned to baseline and stable Postop Assessment: no apparent nausea or vomiting Anesthetic complications: no   No notable events documented.   Last Vitals:  Vitals:   09/01/23 1000 09/01/23 1007  BP: 116/81 (!) 132/92  Pulse: 85 84  Resp: 13 15  Temp: (!) 36.1 C (!) 36.2 C  SpO2: 95% 93%    Last Pain:  Vitals:   09/01/23 1007  TempSrc: Temporal  PainSc: 0-No pain                 Lynwood KANDICE Clause

## 2023-11-07 ENCOUNTER — Other Ambulatory Visit: Payer: Self-pay | Admitting: Family Medicine

## 2023-11-07 DIAGNOSIS — Z1231 Encounter for screening mammogram for malignant neoplasm of breast: Secondary | ICD-10-CM

## 2023-12-10 ENCOUNTER — Ambulatory Visit
Admission: RE | Admit: 2023-12-10 | Discharge: 2023-12-10 | Disposition: A | Discharge status: Home / Self Care | Source: Ambulatory Visit | Attending: Family Medicine | Admitting: Family Medicine

## 2023-12-10 DIAGNOSIS — Z1231 Encounter for screening mammogram for malignant neoplasm of breast: Secondary | ICD-10-CM | POA: Diagnosis present
